# Patient Record
Sex: Male | Born: 1975
Health system: Southern US, Community
[De-identification: ages and names within clinical notes are randomized; demographics above are authoritative.]

## PROBLEM LIST (undated history)

## (undated) DIAGNOSIS — E119 Type 2 diabetes mellitus without complications: Secondary | ICD-10-CM

---

## 2000-07-13 ENCOUNTER — Encounter: Payer: Self-pay | Admitting: Emergency Medicine

## 2000-07-13 ENCOUNTER — Emergency Department (HOSPITAL_COMMUNITY): Admission: EM | Admit: 2000-07-13 | Discharge: 2000-07-13 | Payer: Self-pay | Admitting: Emergency Medicine

## 2003-09-21 ENCOUNTER — Emergency Department (HOSPITAL_COMMUNITY): Admission: EM | Admit: 2003-09-21 | Discharge: 2003-09-21 | Payer: Self-pay | Admitting: Emergency Medicine

## 2011-03-18 ENCOUNTER — Emergency Department (HOSPITAL_COMMUNITY)
Admission: EM | Admit: 2011-03-18 | Discharge: 2011-03-18 | Disposition: A | Discharge status: Home / Self Care | Payer: Self-pay | Attending: Emergency Medicine | Admitting: Emergency Medicine

## 2011-03-18 DIAGNOSIS — K089 Disorder of teeth and supporting structures, unspecified: Secondary | ICD-10-CM | POA: Insufficient documentation

## 2011-03-18 DIAGNOSIS — K029 Dental caries, unspecified: Secondary | ICD-10-CM | POA: Insufficient documentation

## 2011-12-24 ENCOUNTER — Emergency Department (HOSPITAL_COMMUNITY)
Admission: EM | Admit: 2011-12-24 | Discharge: 2011-12-24 | Disposition: A | Discharge status: Home / Self Care | Payer: Self-pay | Attending: Emergency Medicine | Admitting: Emergency Medicine

## 2011-12-24 ENCOUNTER — Encounter (HOSPITAL_COMMUNITY): Payer: Self-pay | Admitting: Emergency Medicine

## 2011-12-24 DIAGNOSIS — K029 Dental caries, unspecified: Secondary | ICD-10-CM | POA: Insufficient documentation

## 2011-12-24 DIAGNOSIS — K0889 Other specified disorders of teeth and supporting structures: Secondary | ICD-10-CM

## 2011-12-24 MED ORDER — OXYCODONE-ACETAMINOPHEN 5-325 MG PO TABS
1.0000 | ORAL_TABLET | Freq: Once | ORAL | Status: AC
  Administered 2011-12-24: 1 via ORAL
  Filled 2011-12-24: qty 1

## 2011-12-24 MED ORDER — PENICILLIN V POTASSIUM 500 MG PO TABS: 500.0000 mg | ORAL_TABLET | Freq: Three times a day (TID) | ORAL | Status: AC

## 2011-12-24 MED ORDER — HYDROCODONE-ACETAMINOPHEN 5-500 MG PO TABS: 1.0000 | ORAL_TABLET | Freq: Four times a day (QID) | ORAL | Status: AC | PRN

## 2011-12-24 MED ORDER — NAPROXEN 500 MG PO TABS: 500.0000 mg | ORAL_TABLET | Freq: Two times a day (BID) | ORAL | Status: AC

## 2011-12-24 NOTE — ED Notes (Signed)
C/o R lower toothache since Saturday.

## 2011-12-24 NOTE — ED Notes (Signed)
Pt states that his tooth broke. Pt states area is painful for chewing. Pt also has abcsess in tooth. Pt states unknown time phrame

## 2011-12-24 NOTE — ED Provider Notes (Signed)
History     CSN: [id number]  Arrival date & time 12/24/11  0131   First MD Initiated Contact with Patient 12/24/11 0142      Chief Complaint  Patient presents with  . Dental Pain    (Consider location/radiation/quality/duration/timing/severity/associated sxs/prior treatment) Patient is a 36 y.o. male presenting with tooth pain. The history is provided by the patient.  Dental PainThe primary symptoms include mouth pain and dental injury. Primary symptoms do not include fever or sore throat. The symptoms began 2 days ago. The symptoms are worsening. The symptoms are recurrent. The symptoms occur constantly.  Additional symptoms include: gum swelling, gum tenderness and jaw pain. Additional symptoms do not include: purulent gums, trismus, facial swelling and ear pain.  Pt states he has had problem with his wisdom teeth. States they grew in wrong and broke off. States he currently has no Secretary/administrator, was told that he needed to get his teeth out. States two days ago, developed severe pain in the right lower molar tooth. States pain and swelling surrounding the tooth. Take ibuprofen and naprosyn with no relief. Denies fever, chills, malaise, facial swelling, swelling under the tongue.   History reviewed. No pertinent past medical history.  History reviewed. No pertinent past surgical history.  No family history on file.  History  Substance Use Topics  . Smoking status: Never Smoker   . Smokeless tobacco: Not on file  . Alcohol Use: No      Review of Systems  Constitutional: Negative for fever and chills.  HENT: Positive for dental problem. Negative for ear pain, congestion, sore throat, facial swelling and neck pain.   Respiratory: Negative.   Cardiovascular: Negative.   Skin: Negative.     Allergies  Review of patient's allergies indicates no known allergies.  Home Medications   Current Outpatient Rx  Name Route Sig Dispense Refill  . IBUPROFEN 100 MG PO CHEW Oral  Chew 100 mg by mouth every 8 (eight) hours as needed. For pain    . NAPROXEN 500 MG PO TABS Oral Take 500 mg by mouth once.      BP 159/110  Pulse 110  Temp 98.7 F (37.1 C) (Oral)  Resp 18  SpO2 98%  Physical Exam  Nursing note and vitals reviewed. Constitutional: He is oriented to person, place, and time. He appears well-developed and well-nourished. No distress.  HENT:       Decal of the right lower 3rd molar. No obvious swelling or abscess noted. Tooth very tender to palpation.   Neck: Neck supple.  Cardiovascular: Normal rate, regular rhythm and normal heart sounds.   Pulmonary/Chest: Breath sounds normal. No respiratory distress. He has no wheezes. He has no rales.  Lymphadenopathy:    He has no cervical adenopathy.  Neurological: He is alert and oriented to person, place, and time.  Skin: Skin is warm and dry.  Psychiatric: He has a normal mood and affect.    ED Course  Procedures (including critical care time)  Pt with dental carries of the 3rd right lower molar. He is afebrile, non toxic, no obvious abscess, no facial swelling, no adenopathy. Will give pain medications, follow up with a dentist.  1. Pain, dental       MDM         Lottie Mussel, PA 12/24/11 0157

## 2011-12-24 NOTE — Discharge Instructions (Signed)
Penicillin for possible infection. Naprosyn for pain. Vicodin for severe pain as prescribed. Follow up with dentist or an oral surgery for further treatment.   Dental Pain A tooth ache may be caused by cavities (tooth decay). Cavities expose the nerve of the tooth to air and hot or cold temperatures. It may come from an infection or abscess (also called a boil or furuncle) around your tooth. It is also often caused by dental caries (tooth decay). This causes the pain you are having. DIAGNOSIS  Your caregiver can diagnose this problem by exam. TREATMENT   If caused by an infection, it may be treated with medications which kill germs (antibiotics) and pain medications as prescribed by your caregiver. Take medications as directed.   Only take over-the-counter or prescription medicines for pain, discomfort, or fever as directed by your caregiver.   Whether the tooth ache today is caused by infection or dental disease, you should see your dentist as soon as possible for further care.  SEEK MEDICAL CARE IF: The exam and treatment you received today has been provided on an emergency basis only. This is not a substitute for complete medical or dental care. If your problem worsens or new problems (symptoms) appear, and you are unable to meet with your dentist, call or return to this location. SEEK IMMEDIATE MEDICAL CARE IF:   You have a fever.   You develop redness and swelling of your face, jaw, or neck.   You are unable to open your mouth.   You have severe pain uncontrolled by pain medicine.  MAKE SURE YOU:   Understand these instructions.   Will watch your condition.   Will get help right away if you are not doing well or get worse.  Document Released: 06/25/2005 Document Revised: 06/14/2011 Document Reviewed: 02/11/2008 [facility] Patient Information 2012 [place], [place].

## 2012-01-06 NOTE — ED Provider Notes (Signed)
Medical screening examination/treatment/procedure(s) were performed by non-physician practitioner and as supervising physician I was immediately available for consultation/collaboration.    Vida Roller, MD 01/06/12 [phone]

## 2014-09-29 ENCOUNTER — Other Ambulatory Visit: Payer: Self-pay | Admitting: Physician Assistant

## 2014-09-29 ENCOUNTER — Ambulatory Visit
Admission: RE | Admit: 2014-09-29 | Discharge: 2014-09-29 | Disposition: A | Discharge status: Home / Self Care | Payer: Self-pay | Source: Ambulatory Visit | Attending: Physician Assistant | Admitting: Physician Assistant

## 2014-09-29 DIAGNOSIS — R062 Wheezing: Secondary | ICD-10-CM

## 2015-06-30 ENCOUNTER — Emergency Department (HOSPITAL_COMMUNITY): Payer: BLUE CROSS/BLUE SHIELD

## 2015-06-30 ENCOUNTER — Emergency Department (HOSPITAL_COMMUNITY)
Admission: EM | Admit: 2015-06-30 | Discharge: 2015-06-30 | Disposition: A | Discharge status: Home / Self Care | Payer: BLUE CROSS/BLUE SHIELD | Attending: Emergency Medicine | Admitting: Emergency Medicine

## 2015-06-30 ENCOUNTER — Encounter (HOSPITAL_COMMUNITY): Payer: Self-pay | Admitting: Emergency Medicine

## 2015-06-30 DIAGNOSIS — R079 Chest pain, unspecified: Secondary | ICD-10-CM | POA: Diagnosis present

## 2015-06-30 DIAGNOSIS — M6281 Muscle weakness (generalized): Secondary | ICD-10-CM | POA: Diagnosis not present

## 2015-06-30 DIAGNOSIS — R739 Hyperglycemia, unspecified: Secondary | ICD-10-CM | POA: Diagnosis not present

## 2015-06-30 DIAGNOSIS — M549 Dorsalgia, unspecified: Secondary | ICD-10-CM | POA: Insufficient documentation

## 2015-06-30 DIAGNOSIS — R0789 Other chest pain: Secondary | ICD-10-CM | POA: Diagnosis not present

## 2015-06-30 LAB — BASIC METABOLIC PANEL
Anion gap: 10 (ref 5–15)
BUN: 18 mg/dL (ref 6–20)
CALCIUM: 9.4 mg/dL (ref 8.9–10.3)
CO2: 26 mmol/L (ref 22–32)
CREATININE: 1.15 mg/dL (ref 0.61–1.24)
Chloride: 100 mmol/L — ABNORMAL LOW (ref 101–111)
Glucose, Bld: 313 mg/dL — ABNORMAL HIGH (ref 65–99)
Potassium: 4 mmol/L (ref 3.5–5.1)
SODIUM: 136 mmol/L (ref 135–145)

## 2015-06-30 LAB — CBC
HCT: 41.6 % (ref 39.0–52.0)
Hemoglobin: 14.1 g/dL (ref 13.0–17.0)
MCH: 27.9 pg (ref 26.0–34.0)
MCHC: 33.9 g/dL (ref 30.0–36.0)
MCV: 82.4 fL (ref 78.0–100.0)
PLATELETS: 316 10*3/uL (ref 150–400)
RBC: 5.05 MIL/uL (ref 4.22–5.81)
RDW: 12.3 % (ref 11.5–15.5)
WBC: 7.2 10*3/uL (ref 4.0–10.5)

## 2015-06-30 LAB — I-STAT TROPONIN, ED: TROPONIN I, POC: 0 ng/mL (ref 0.00–0.08)

## 2015-06-30 MED ORDER — METFORMIN HCL 500 MG PO TABS
500.0000 mg | ORAL_TABLET | Freq: Once | ORAL | Status: AC
  Administered 2015-06-30: 500 mg via ORAL
  Filled 2015-06-30: qty 1

## 2015-06-30 MED ORDER — METFORMIN HCL 500 MG PO TABS: 500.0000 mg | ORAL_TABLET | Freq: Two times a day (BID) | ORAL | Status: DC

## 2015-06-30 NOTE — ED Provider Notes (Signed)
CSN: [id number]     Arrival date & time 06/30/15  1041 History   First MD Initiated Contact with Patient 06/30/15 1056     Chief Complaint  Patient presents with  . Chest Pain  . Back Pain  . hand weakness     HPI  Patient presents with concern of chest pain. Patient has had 2 prior, brief episodes in the past week, but today, had a slightly more sustained event. He was at rest, at work, when he had approximate 5-10 minutes of pressure in the central chest. With no radiation, no concurrent dyspnea, no lightheadedness, no sicca be, no nausea. Patient denies known medical problems, does not smoke, drinks minimally. He has a family history of diabetes, hypertension. No recent travel, new medication, no activity. Currently, the patient has no ongoing complaint.   History reviewed. No pertinent past medical history. History reviewed. No pertinent past surgical history. No family history on file. Social History  Substance Use Topics  . Smoking status: Never Smoker   . Smokeless tobacco: None  . Alcohol Use: No    Review of Systems  Constitutional:       Per HPI, otherwise negative  HENT:       Per HPI, otherwise negative  Respiratory:       Per HPI, otherwise negative  Cardiovascular:       Per HPI, otherwise negative  Gastrointestinal: Negative for vomiting.  Endocrine:       Negative aside from HPI  Genitourinary:       Neg aside from HPI   Musculoskeletal:       Per HPI, otherwise negative  Skin: Negative.   Neurological: Negative for syncope.      Allergies  Review of patient's allergies indicates no known allergies.  Home Medications   Prior to Admission medications   Medication Sig Start Date End Date Taking? Authorizing Provider  ibuprofen (ADVIL,MOTRIN) 200 MG tablet Take 400 mg by mouth every 6 (six) hours as needed for headache or moderate pain.   Yes Historical Provider, MD   BP 128/94 mmHg  Pulse 78  Temp(Src) 97.9 F (36.6 C) (Oral)  Resp 16   Ht 5\' 8"  (1.727 m)  Wt 240 lb (108.863 kg)  BMI 36.50 kg/m2  SpO2 99% Physical Exam  Constitutional: He is oriented to person, place, and time. He appears well-developed. No distress.  HENT:  Head: Normocephalic and atraumatic.  Eyes: Conjunctivae and EOM are normal.  Cardiovascular: Normal rate and regular rhythm.   Pulmonary/Chest: Effort normal. No stridor. No respiratory distress.  Abdominal: He exhibits no distension. There is no tenderness.  Musculoskeletal: He exhibits no edema.  Neurological: He is alert and oriented to person, place, and time.  Skin: Skin is warm and dry.  Psychiatric: He has a normal mood and affect.  Nursing note and vitals reviewed.   ED Course  Procedures (including critical care time) Labs Review Labs Reviewed  CBC  BASIC METABOLIC PANEL  [facility] SensorI-STAT TROPOININ, ED    Imaging Review Dg Chest 2 View  06/30/2015  CLINICAL DATA:  New mid chest pain this morning. EXAM: CHEST  2 VIEW COMPARISON:  09/29/2014 FINDINGS: The heart size and mediastinal contours are within normal limits. Both lungs are clear. The visualized skeletal structures are unremarkable. IMPRESSION: No active cardiopulmonary disease. Electronically Signed   By: Charlett NoseKevin  Dover M.D.   On: 06/30/2015 11:16   I have personally reviewed and evaluated these images and lab results as part of my medical  decision-making.   EKG Interpretation   Date/Time:  Thursday June 30 2015 10:47:07 EST Ventricular Rate:  69 PR Interval:  161 QRS Duration: 86 QT Interval:  366 QTC Calculation: 392 R Axis:   -38 Text Interpretation:  Sinus rhythm Left axis deviation Probable  anteroseptal infarct, old Minimal ST elevation, lateral leads No previous  ECGs available Confirmed by NGUYEN, EMILY ([id number]) on 06/30/2015 10:51:03  AM     2:00 PM On repeat exam the patient is in no distress. We discussed all findings per Patient states that prior labs demonstrated glucose greater than 300, while  fasting. With 2 values greater than 300, concern for diabetes, the patient will start metformin.  MDM  Presents after episodes of chest pain. Patient is awake, alert, with nonischemic, unremarkable heart rhythm. Patient is found to have hyperglycemia, otherwise all reassuring labs. Given his persistent hyperglycemia, the patient was tried on metformin. Patient discharged in stable condition.  Gerhard Munch, MD 06/30/15 1400

## 2015-06-30 NOTE — Discharge Instructions (Signed)
As discussed, today's evaluation has been largely reassuring aside from evidence of ongoing high blood sugar.  It is very important that you follow-up with your primary care physician, both for repeat laboratory evaluation, as well as for further evaluation of your episodic chest pain.  Issues of new, or concerning changes in your condition, please be sure to return here.

## 2015-06-30 NOTE — ED Notes (Addendum)
Pt c/o central chest pain that started at work little while ago. Pt states that he was sitting at his desk working when pain started.  Pt states that pain last 5-10 mins then went away.  Pt denies any radiation of pain.  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

## 2016-03-21 IMAGING — CR DG CHEST 2V
2 series · 2 of 2 positions shown · non-contrast
Comparison: None.

CLINICAL DATA: Wheezing and chest congestion for 2 months.

EXAM:
CHEST  2 VIEW

[w chest pa]
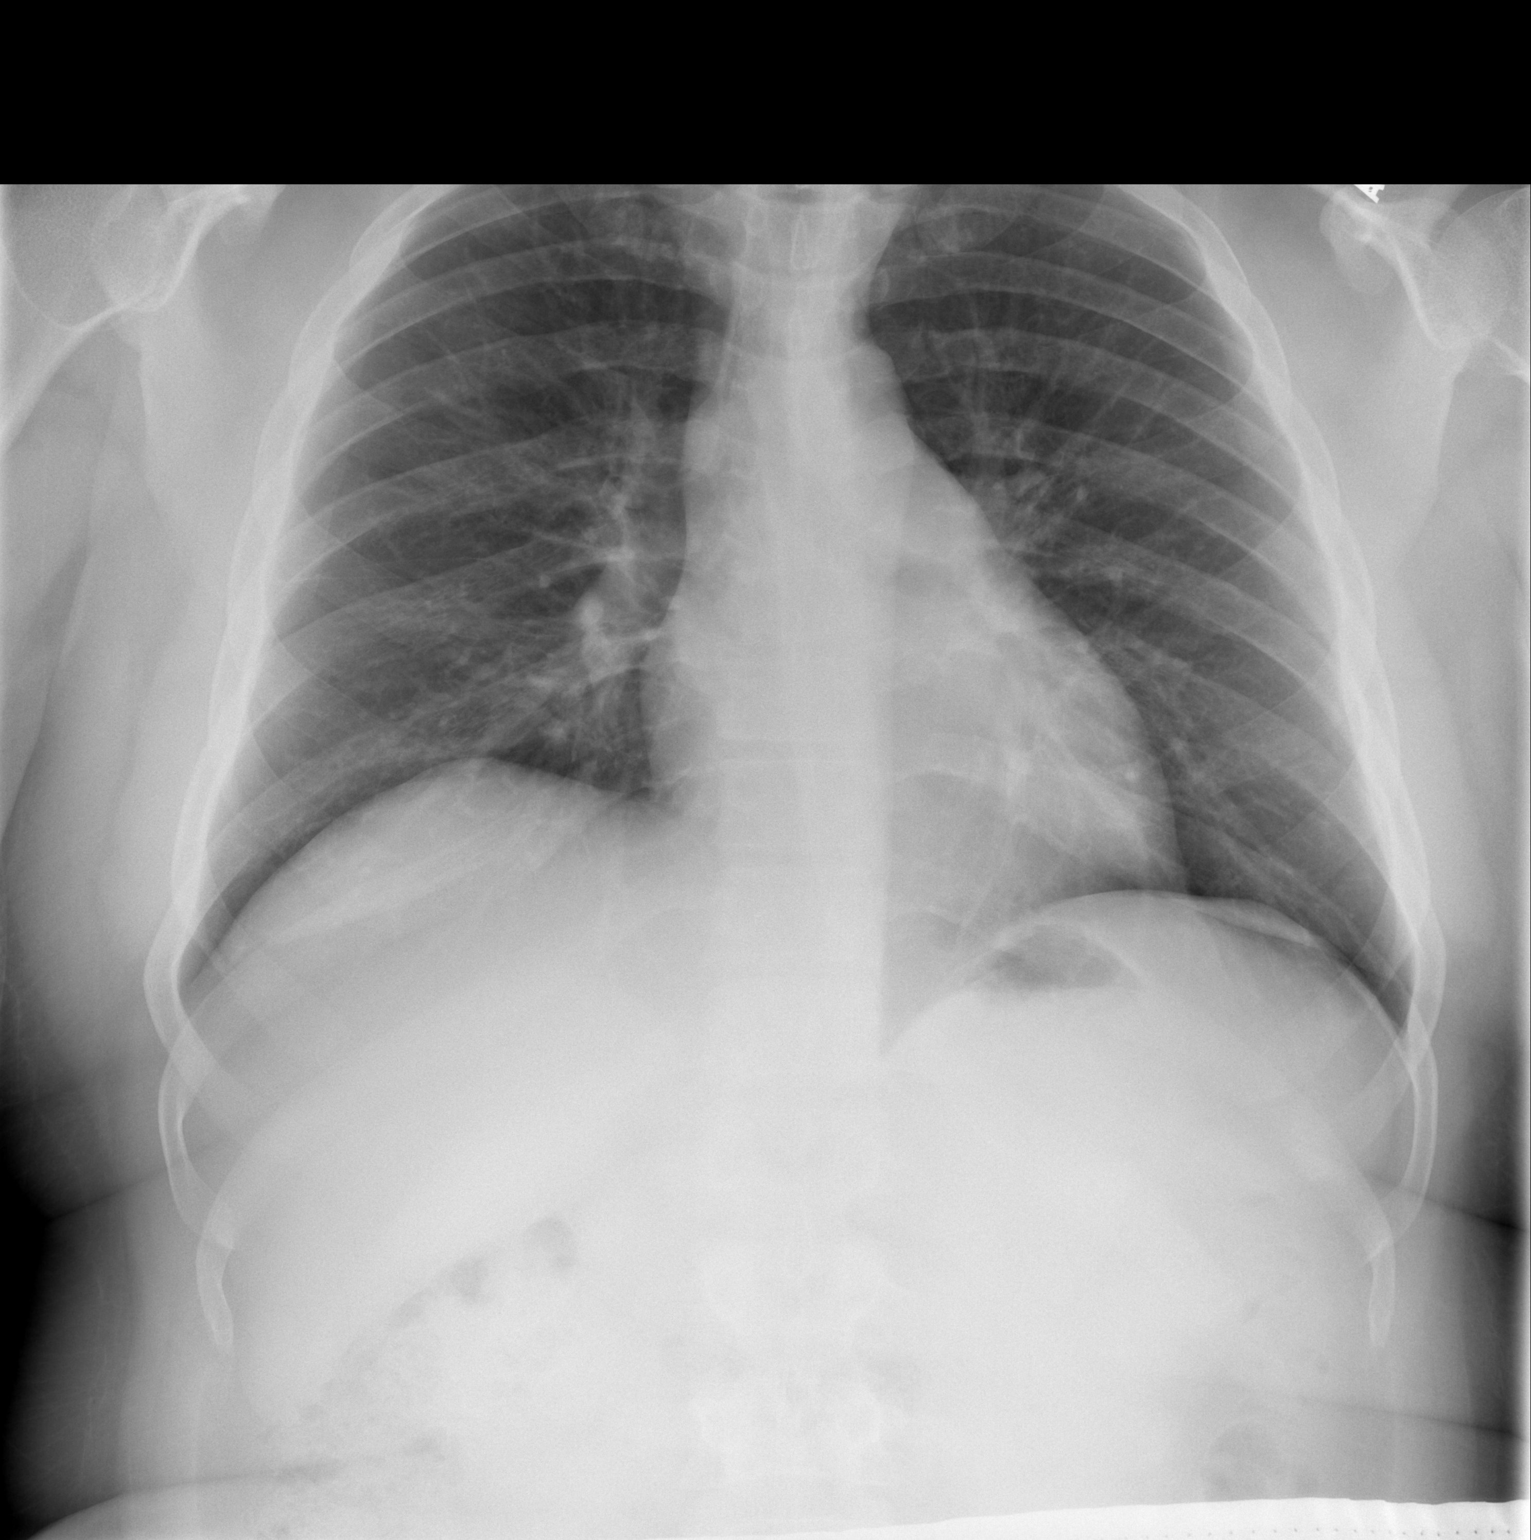

[w chest lat]
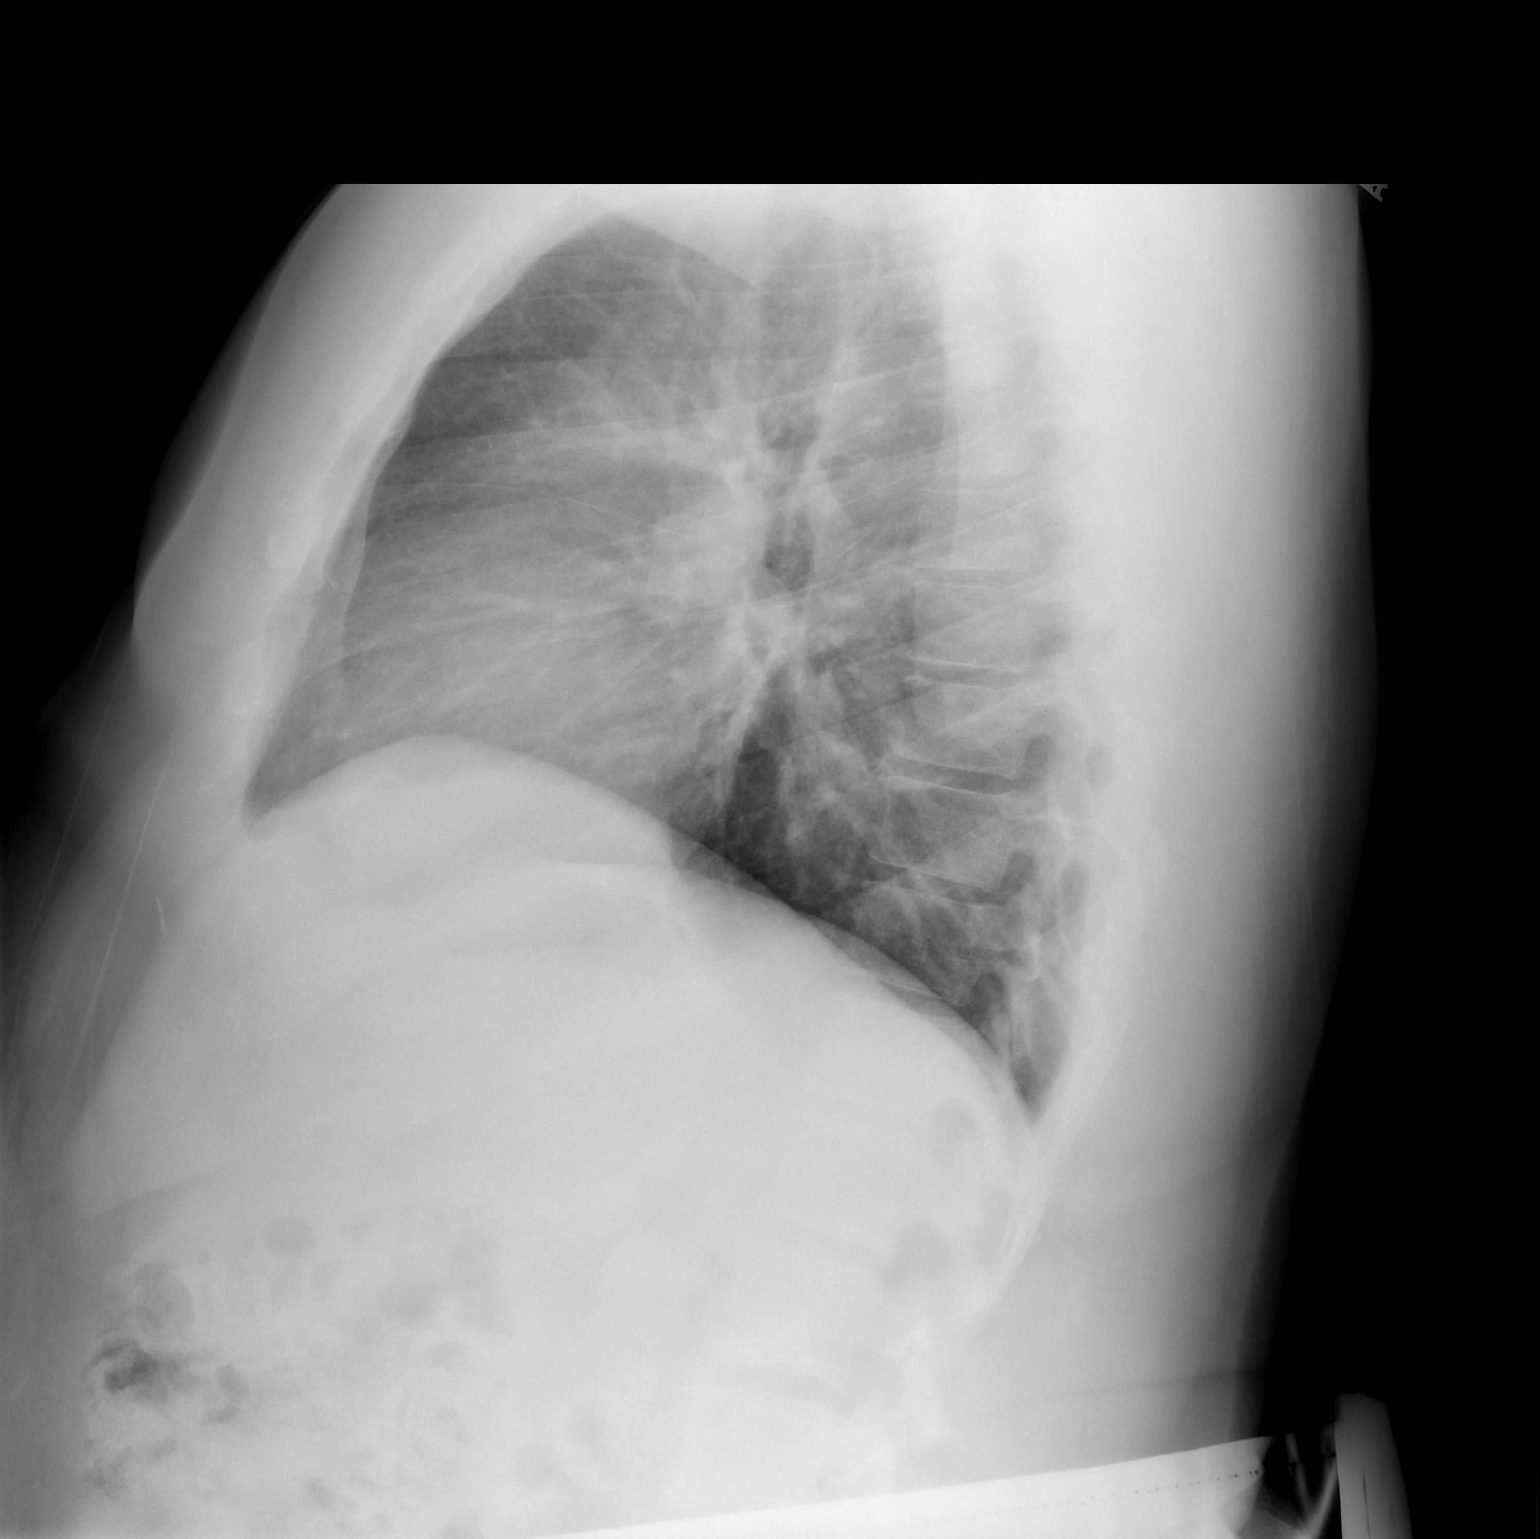

[2 of 2 positions shown; findings below may reference images not displayed]

FINDINGS: The cardiac silhouette, mediastinal and hilar contours are normal.
The lungs are clear. No pleural effusion. The bony thorax is intact.
IMPRESSION: No acute cardiopulmonary findings.

## 2016-10-15 ENCOUNTER — Emergency Department (HOSPITAL_COMMUNITY)
Admission: EM | Admit: 2016-10-15 | Discharge: 2016-10-15 | Disposition: A | Discharge status: Home / Self Care | Payer: BLUE CROSS/BLUE SHIELD | Attending: Emergency Medicine | Admitting: Emergency Medicine

## 2016-10-15 ENCOUNTER — Encounter (HOSPITAL_COMMUNITY): Payer: Self-pay | Admitting: *Deleted

## 2016-10-15 DIAGNOSIS — H9311 Tinnitus, right ear: Secondary | ICD-10-CM | POA: Diagnosis not present

## 2016-10-15 DIAGNOSIS — H66001 Acute suppurative otitis media without spontaneous rupture of ear drum, right ear: Secondary | ICD-10-CM | POA: Diagnosis not present

## 2016-10-15 MED ORDER — AMOXICILLIN 500 MG PO CAPS: 1000.0000 mg | ORAL_CAPSULE | Freq: Two times a day (BID) | ORAL | 0 refills | Status: DC

## 2016-10-15 NOTE — ED Notes (Signed)
Pt states headache x2 days. Tonight was awakened at ~0230 with high-pitched ringing in his ears (primarily the right ear). Pt states headache is a 2/10 now. Location of headache is all around the top of his head. Bright light makes his headache worse. Pt denies any recent injury/ falls/ trauma. Pt states in addition to his diabetes, he also has a history of vertigo which he takes OTC meclizine for.

## 2016-10-15 NOTE — Discharge Instructions (Signed)
Follow up with ENT for further evaluation of this

## 2016-10-15 NOTE — ED Triage Notes (Signed)
Headache for 4 days  Ringing in his rt ear tonight  It woke him up from sleep

## 2016-10-15 NOTE — ED Provider Notes (Signed)
[facility]-EMERGENCY DEPT Provider Note   CSN: [id number] Arrival date & time: 10/15/16  0320     History   Chief Complaint Chief Complaint  Patient presents with  . Headache    HPI Devin Boyd is a 41 y.o. male.  41 yo M with a chief complaints of tinnitus. This been going on acutely this evening. He had a mild headache at the started the week but this resolved. Denies any hearing loss. Denies any injury to his head or ear. Patient denies recent loud noise exposure. He describes as a high-pitched wine. Continuous in nature. He states he has had some congestion as well as a sensation that fluid is moving around in his ears especially when he lays on his left side. He feels that the ringing is mostly in the right ear.   The history is provided by the patient.  Headache   Pertinent negatives include no fever, no palpitations, no shortness of breath and no vomiting.  Illness  This is a new problem. The current episode started 3 to 5 hours ago. The problem occurs constantly. The problem has not changed since onset.Pertinent negatives include no chest pain, no abdominal pain, no headaches and no shortness of breath. Nothing aggravates the symptoms. Nothing relieves the symptoms. He has tried nothing for the symptoms. The treatment provided no relief.    History reviewed. No pertinent past medical history.  There are no active problems to display for this patient.   History reviewed. No pertinent surgical history.     Home Medications    Prior to Admission medications   Medication Sig Start Date End Date Taking? Authorizing Provider  metFORMIN (GLUCOPHAGE) 500 MG tablet Take 1 tablet (500 mg total) by mouth 2 (two) times daily with a meal. 06/30/15  Yes Gerhard Munch, MD  amoxicillin (AMOXIL) 500 MG capsule Take 2 capsules (1,000 mg total) by mouth 2 (two) times daily. 10/15/16   Melene Plan, DO    Family History No family history on file.  Social History Social History    Substance Use Topics  . Smoking status: Never Smoker  . Smokeless tobacco: Never Used  . Alcohol use No     Allergies   Patient has no known allergies.   Review of Systems Review of Systems  Constitutional: Negative for chills and fever.  HENT: Positive for congestion and tinnitus. Negative for facial swelling.   Eyes: Negative for discharge and visual disturbance.  Respiratory: Negative for shortness of breath.   Cardiovascular: Negative for chest pain and palpitations.  Gastrointestinal: Negative for abdominal pain, diarrhea and vomiting.  Musculoskeletal: Negative for arthralgias and myalgias.  Skin: Negative for color change and rash.  Neurological: Negative for tremors, syncope and headaches.  Psychiatric/Behavioral: Negative for confusion and dysphoric mood.     Physical Exam Updated Vital Signs BP 137/87   Pulse 70   Temp 97.8 F (36.6 C)   Resp 18   Ht  (1.727 m)   Wt 280 lb 3 oz (127.1 kg)   SpO2 97%   BMI 42.60 kg/m   Physical Exam  Constitutional: He is oriented to person, place, and time. He appears well-developed and well-nourished.  HENT:  Head: Normocephalic and atraumatic.  Effusion noted to the bilateral TMs. Right TM with some increased vascularity and erythema. No noted bulging.  Eyes: EOM are normal. Pupils are equal, round, and reactive to light.  Neck: Normal range of motion. Neck supple. No JVD present.  Cardiovascular: Normal rate and regular  rhythm.  Exam reveals no gallop and no friction rub.   No murmur heard. Pulmonary/Chest: No respiratory distress. He has no wheezes.  Abdominal: He exhibits no distension and no mass. There is no tenderness. There is no rebound and no guarding.  Musculoskeletal: Normal range of motion.  Neurological: He is alert and oriented to person, place, and time.  Skin: No rash noted. No pallor.  Psychiatric: He has a normal mood and affect. His behavior is normal.  Nursing note and vitals  reviewed.    ED Treatments / Results  Labs (all labs ordered are listed, but only abnormal results are displayed) Labs Reviewed - No data to display  EKG  EKG Interpretation None       Radiology No results found.  Procedures Procedures (including critical care time)  Medications Ordered in ED Medications - No data to display   Initial Impression / Assessment and Plan / ED Course  I have reviewed the triage vital signs and the nursing notes.  Pertinent labs & imaging results that were available during my care of the patient were reviewed by me and considered in my medical decision making (see chart for details).     41 yo M With a chief complaint of tinnitus. He has a normal neuro exam. Right TM is likely infected based on exam. Will start on antibiotics. Patient has had a history of some peripheral vertigo off and on with new tinnitus will have him see ENT for further evaluation.  6:54 AM:  I have discussed the diagnosis/risks/treatment options with the patient and family and believe the pt to be eligible for discharge home to follow-up with ENT. We also discussed returning to the ED immediately if new or worsening sx occur. We discussed the sx which are most concerning (e.g., sudden worsening pain, fever, inability to tolerate by mouth) that necessitate immediate return. Medications administered to the patient during their visit and any new prescriptions provided to the patient are listed below.  Medications given during this visit Medications - No data to display   The patient appears reasonably screen and/or stabilized for discharge and I doubt any other medical condition or other [facility] requiring further screening, evaluation, or treatment in the ED at this time prior to discharge.    Final Clinical Impressions(s) / ED Diagnoses   Final diagnoses:  Tinnitus of right ear  Acute suppurative otitis media of right ear without spontaneous rupture of tympanic membrane,  recurrence not specified    New Prescriptions New Prescriptions   AMOXICILLIN (AMOXIL) 500 MG CAPSULE    Take 2 capsules (1,000 mg total) by mouth 2 (two) times daily.     Melene Plan, DO 10/15/16 [phone]

## 2016-12-20 IMAGING — CR DG CHEST 2V
2 series · 2 of 2 positions shown · non-contrast
Comparison: 09/29/2014

CLINICAL DATA: New mid chest pain this morning.

EXAM:
CHEST  2 VIEW

[w chest pa]
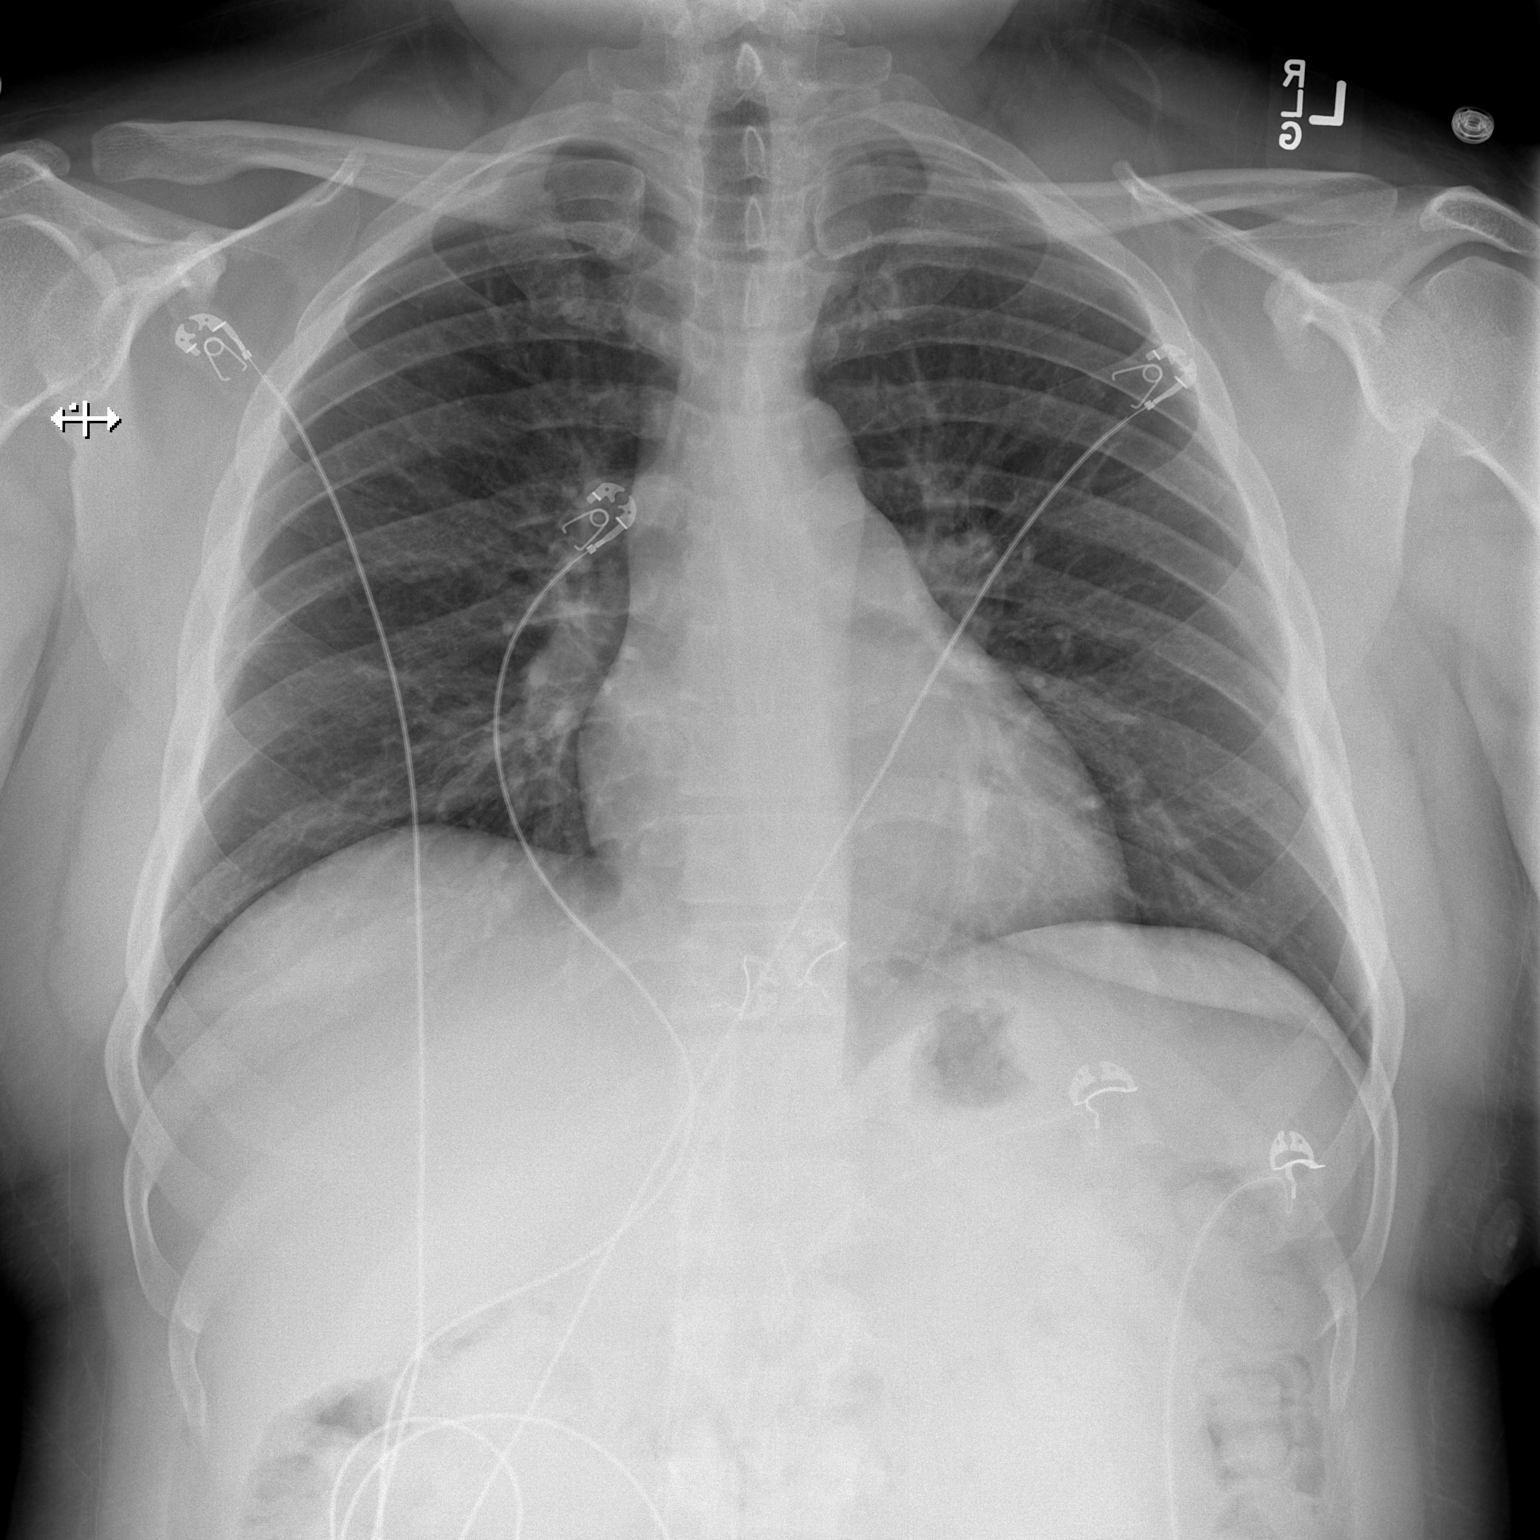

[w chest lat]
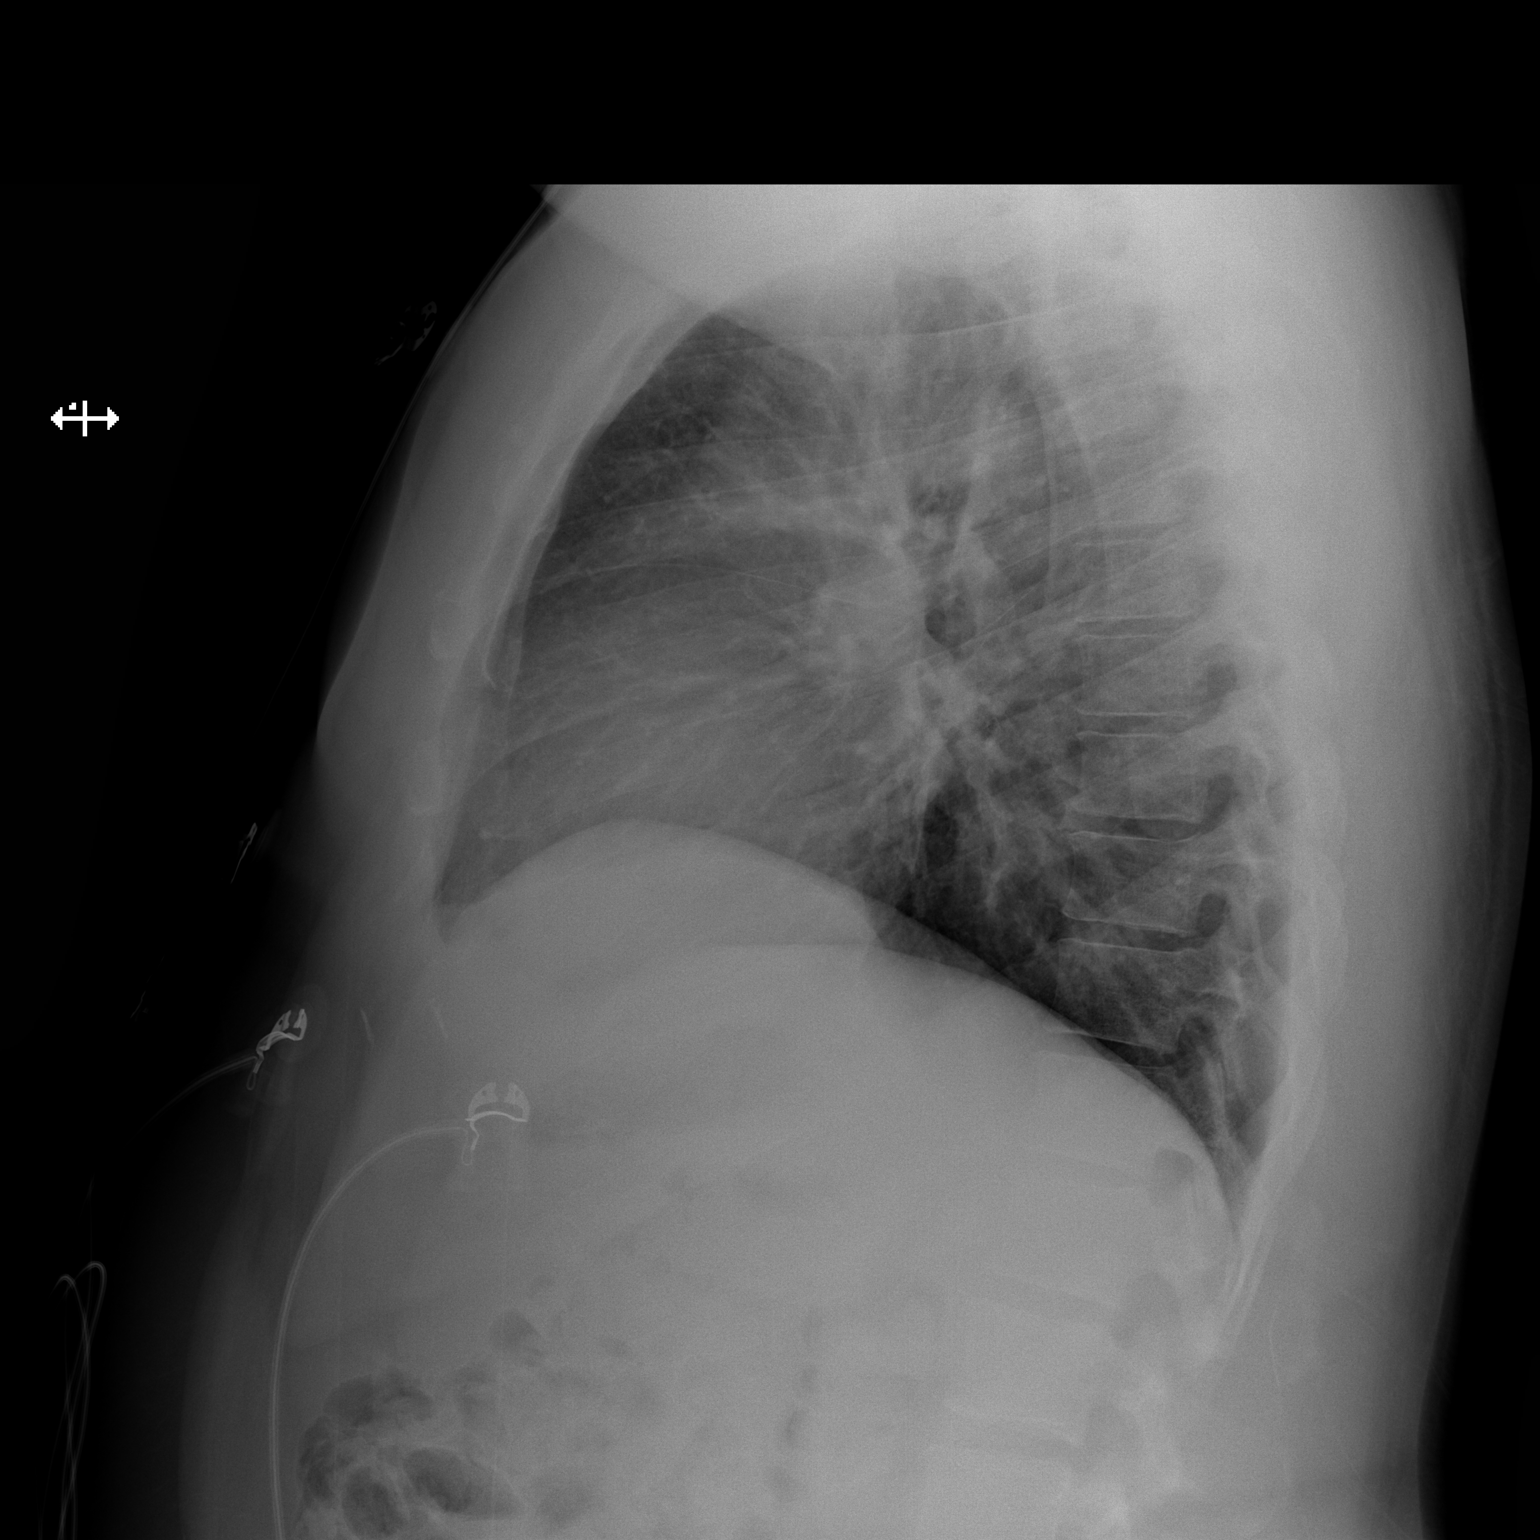

[2 of 2 positions shown; findings below may reference images not displayed]

FINDINGS: The heart size and mediastinal contours are within normal limits.
Both lungs are clear. The visualized skeletal structures are
unremarkable.
IMPRESSION: No active cardiopulmonary disease.

## 2017-08-05 ENCOUNTER — Emergency Department (HOSPITAL_COMMUNITY): Payer: BLUE CROSS/BLUE SHIELD

## 2017-08-05 ENCOUNTER — Other Ambulatory Visit: Payer: Self-pay

## 2017-08-05 ENCOUNTER — Encounter (HOSPITAL_COMMUNITY): Payer: Self-pay

## 2017-08-05 ENCOUNTER — Emergency Department (HOSPITAL_COMMUNITY)
Admission: EM | Admit: 2017-08-05 | Discharge: 2017-08-05 | Disposition: A | Discharge status: Home / Self Care | Payer: BLUE CROSS/BLUE SHIELD | Attending: Emergency Medicine | Admitting: Emergency Medicine

## 2017-08-05 DIAGNOSIS — E119 Type 2 diabetes mellitus without complications: Secondary | ICD-10-CM | POA: Diagnosis not present

## 2017-08-05 DIAGNOSIS — Z7984 Long term (current) use of oral hypoglycemic drugs: Secondary | ICD-10-CM | POA: Diagnosis not present

## 2017-08-05 DIAGNOSIS — B349 Viral infection, unspecified: Secondary | ICD-10-CM | POA: Diagnosis not present

## 2017-08-05 DIAGNOSIS — R509 Fever, unspecified: Secondary | ICD-10-CM | POA: Diagnosis present

## 2017-08-05 HISTORY — DX: Type 2 diabetes mellitus without complications: E11.9

## 2017-08-05 MED ORDER — NAPROXEN 500 MG PO TABS: 500.0000 mg | ORAL_TABLET | Freq: Two times a day (BID) | ORAL | 0 refills | Status: AC

## 2017-08-05 MED ORDER — FLUTICASONE PROPIONATE 50 MCG/ACT NA SUSP: 1.0000 | Freq: Every day | NASAL | 2 refills | Status: DC

## 2017-08-05 MED ORDER — ACETAMINOPHEN 325 MG PO TABS
650.0000 mg | ORAL_TABLET | Freq: Once | ORAL | Status: AC
Start: 2017-08-05 — End: 2017-08-05
  Administered 2017-08-05: 650 mg via ORAL
  Filled 2017-08-05: qty 2

## 2017-08-05 MED ORDER — BENZONATATE 100 MG PO CAPS: 100.0000 mg | ORAL_CAPSULE | Freq: Three times a day (TID) | ORAL | 0 refills | Status: DC | PRN

## 2017-08-05 NOTE — ED Triage Notes (Signed)
Patient complains of dry cough with congestion, sore throat and low grade fever since riding bus this past Wednesday, NAD

## 2017-08-05 NOTE — Discharge Instructions (Signed)
You were seen in the emergency today and diagnosed with a viral illness.  Your chest x-ray did not show pneumonia. I have prescribed you multiple medications to treat your symptoms.   -Flonase to be used 1 spray in each nostril daily.  This medication is used to treat your congestion.  -Tessalon can be taken once every 8 hours as needed.  This medication is used to treat your cough.  - Naproxen- Naproxen is a nonsteroidal anti-inflammatory medication that will help with pain and swelling. Be sure to take this medication as prescribed with food, 1 pill every 12 hours,  It should be taken with food, as it can cause stomach upset, and more seriously, stomach bleeding. Do not take other nonsteroidal anti-inflammatory medications with this such as Advil, Motrin, or Aleve. You may supplement with Tylenol.   You will need to follow-up with your primary care provider in 1 week if your symptoms have not improved.  If you do not have a primary care provider one is provided in your discharge instructions.  Return to the emergency department for any new or worsening symptoms including but not limited to persistent fever for 5 days, difficulty breathing, chest pain, or passing out.

## 2017-08-05 NOTE — ED Notes (Signed)
Patient transported to X-ray 

## 2017-08-05 NOTE — ED Provider Notes (Signed)
[facility] EMERGENCY DEPARTMENT Provider Note   CSN: [id number] Arrival date & time: 08/05/17  1049     History   Chief Complaint No chief complaint on file.   HPI Devin Boyd is a 42 y.o. male with a hx of DM who presents to the ED complaining progressively worsening URI sxs x 6 days. Patient states he has been experiencing congestion, sore throat, dry cough, chest discomfort only with coughing, low grade fevers, generalized malaise, body aches, and headaches. States headaches are gradual onset with steady progression, hx of similar. Rates overall pain a 9/10 in severity, states coughing and movement makes overall discomfort worse, no significant alleviating factors. Has tried robitussin, mucinex, and Nyquil without relief. Has also tried advil without relief.  Reports recent sick contacts with people on ski trip bus. Patient did receive influenza vaccine. Denies dyspnea, palpitations, ear pain, numbness, weakness, or change in vision.   HPI  Past Medical History:  Diagnosis Date  . Diabetes mellitus without complication (HCC)     There are no active problems to display for this patient.   History reviewed. No pertinent surgical history.     Home Medications    Prior to Admission medications   Medication Sig Start Date End Date Taking? Authorizing Provider  amoxicillin (AMOXIL) 500 MG capsule Take 2 capsules (1,000 mg total) by mouth 2 (two) times daily. 10/15/16   Melene Plan, DO  metFORMIN (GLUCOPHAGE) 500 MG tablet Take 1 tablet (500 mg total) by mouth 2 (two) times daily with a meal. 06/30/15   Gerhard Munch, MD    Family History History reviewed. No pertinent family history.  Social History Social History   Tobacco Use  . Smoking status: Never Smoker  . Smokeless tobacco: Never Used  Substance Use Topics  . Alcohol use: No  . Drug use: No     Allergies   Patient has no known allergies.   Review of Systems Review of Systems    Constitutional: Positive for fever.  HENT: Positive for congestion and sore throat. Negative for ear pain and trouble swallowing.   Eyes: Negative for visual disturbance.  Respiratory: Positive for cough. Negative for shortness of breath.   Cardiovascular: Positive for chest pain (only with coughing). Negative for palpitations.  Gastrointestinal: Negative for abdominal pain, diarrhea and vomiting.  Musculoskeletal: Positive for myalgias (generalized).  Neurological: Positive for headaches. Negative for weakness and numbness.  All other systems reviewed and are negative.    Physical Exam Updated Vital Signs BP (!) 148/94 (BP Location: Right Arm)   Pulse (!) 102   Temp 100 F (37.8 C) (Oral)   Resp 16   SpO2 93%   Physical Exam  Constitutional: He appears well-developed and well-nourished.  Non-toxic appearance. No distress.  HENT:  Head: Normocephalic and atraumatic.  Right Ear: Tympanic membrane is not perforated, not erythematous, not retracted and not bulging.  Left Ear: Tympanic membrane is not perforated, not erythematous, not retracted and not bulging.  Nose: Mucosal edema present. Right sinus exhibits no maxillary sinus tenderness and no frontal sinus tenderness. Left sinus exhibits no maxillary sinus tenderness and no frontal sinus tenderness.  Mouth/Throat: Uvula is midline and oropharynx is clear and moist. No oropharyngeal exudate or posterior oropharyngeal erythema.  Eyes: Conjunctivae and EOM are normal. Pupils are equal, round, and reactive to light. Right eye exhibits no discharge. Left eye exhibits no discharge.  Neck: Normal range of motion. Neck supple.  Cardiovascular: Normal rate and regular rhythm.  No  murmur heard. Pulmonary/Chest: Breath sounds normal. No respiratory distress. He has no wheezes. He has no rales.  Abdominal: Soft. He exhibits no distension. There is no tenderness.  Lymphadenopathy:    He has no cervical adenopathy.  Neurological: He is  alert.  Clear speech.  5 out of 5 grip strength bilaterally.  5 out of 5 strength with plantar and dorsiflexion bilaterally.  Sensation grossly intact bilateral upper and lower extremities.  Gait normal.  Skin: Skin is warm and dry. No rash noted.  Psychiatric: He has a normal mood and affect. His behavior is normal.  Nursing note and vitals reviewed.   ED Treatments / Results  Labs (all labs ordered are listed, but only abnormal results are displayed) Labs Reviewed - No data to display  EKG  EKG Interpretation None       Radiology Dg Chest 2 View  Result Date: 08/05/2017 CLINICAL DATA:  Nonproductive cough, chest congestion, low-grade fever, and sore throat for the past 5 days. Nonsmoker. EXAM: CHEST  2 VIEW COMPARISON:  Chest x-ray of June 30, 2015 FINDINGS: The lungs are adequately inflated. There is no focal infiltrate. There is no pleural effusion. The heart and pulmonary vascularity are normal. The mediastinum is normal in width. The trachea is midline. The bony thorax exhibits no acute abnormality. IMPRESSION: There is no pneumonia nor other acute cardiopulmonary abnormality. Electronically Signed   By: David  SwazilandJordan M.D.   On: 08/05/2017 11:45    Procedures Procedures (including critical care time)  Medications Ordered in ED Medications - No data to display   Initial Impression / Assessment and Plan / ED Course  I have reviewed the triage vital signs and the nursing notes.  Pertinent labs & imaging results that were available during my care of the patient were reviewed by me and considered in my medical decision making (see chart for details).    Patient presents with symptoms consistent with viral illness. He is nontoxic appearing, initially slightly tachycardic this improved on my exam, also with elevated blood pressure- no indication of HTN emergency, discussed with patient he will need to have this rechecked. Patient is afebrile and without adventitious sounds on  lung exam, CXR negative for infiltrate, doubt PNA. No wheezing on exam. Afebrile, no sinus tenderness, doubt sinusitis. Centor score 0, doubt strep pharyngitis. Suspect viral etiology, will treat supportively with Naproxen, Flonase, and Tessalon. I discussed results, treatment plan, need for PCP follow-up, and return precautions with the patient. Provided opportunity for questions, patient confirmed understanding and is in agreement with plan.    Final Clinical Impressions(s) / ED Diagnoses   Final diagnoses:  Viral illness    ED Discharge Orders        Ordered    naproxen (NAPROSYN) 500 MG tablet  2 times daily     08/05/17 1225    fluticasone (FLONASE) 50 MCG/ACT nasal spray  Daily     08/05/17 1225    benzonatate (TESSALON) 100 MG capsule  3 times daily PRN     08/05/17 [address]1225       Anup Brigham, ElwoodSamantha R, PA-C 08/05/17 1757    Bethann BerkshireZammit, Joseph, MD 08/10/17 1154

## 2017-08-05 NOTE — ED Notes (Signed)
Pt stable, ambulatory and verbalizes understanding of d/c instructions.   Pt unable to sign signature pad due to a connection issue.

## 2018-01-16 DIAGNOSIS — E118 Type 2 diabetes mellitus with unspecified complications: Secondary | ICD-10-CM | POA: Diagnosis not present

## 2018-01-16 DIAGNOSIS — Z9109 Other allergy status, other than to drugs and biological substances: Secondary | ICD-10-CM | POA: Diagnosis not present

## 2018-01-16 DIAGNOSIS — E782 Mixed hyperlipidemia: Secondary | ICD-10-CM | POA: Diagnosis not present

## 2018-01-16 DIAGNOSIS — K649 Unspecified hemorrhoids: Secondary | ICD-10-CM | POA: Diagnosis not present

## 2018-01-27 DIAGNOSIS — E782 Mixed hyperlipidemia: Secondary | ICD-10-CM | POA: Diagnosis not present

## 2018-01-27 DIAGNOSIS — E1169 Type 2 diabetes mellitus with other specified complication: Secondary | ICD-10-CM | POA: Diagnosis not present

## 2018-06-09 DIAGNOSIS — K649 Unspecified hemorrhoids: Secondary | ICD-10-CM | POA: Diagnosis not present

## 2018-06-09 DIAGNOSIS — E1169 Type 2 diabetes mellitus with other specified complication: Secondary | ICD-10-CM | POA: Diagnosis not present

## 2018-06-09 DIAGNOSIS — Z9109 Other allergy status, other than to drugs and biological substances: Secondary | ICD-10-CM | POA: Diagnosis not present

## 2018-06-09 DIAGNOSIS — E782 Mixed hyperlipidemia: Secondary | ICD-10-CM | POA: Diagnosis not present

## 2018-06-11 DIAGNOSIS — E782 Mixed hyperlipidemia: Secondary | ICD-10-CM | POA: Diagnosis not present

## 2018-06-11 DIAGNOSIS — E1169 Type 2 diabetes mellitus with other specified complication: Secondary | ICD-10-CM | POA: Diagnosis not present

## 2018-06-18 DIAGNOSIS — E782 Mixed hyperlipidemia: Secondary | ICD-10-CM | POA: Diagnosis not present

## 2018-06-18 DIAGNOSIS — E1169 Type 2 diabetes mellitus with other specified complication: Secondary | ICD-10-CM | POA: Diagnosis not present

## 2018-10-20 DIAGNOSIS — Z9109 Other allergy status, other than to drugs and biological substances: Secondary | ICD-10-CM | POA: Diagnosis not present

## 2018-10-20 DIAGNOSIS — E1169 Type 2 diabetes mellitus with other specified complication: Secondary | ICD-10-CM | POA: Diagnosis not present

## 2018-10-20 DIAGNOSIS — Z760 Encounter for issue of repeat prescription: Secondary | ICD-10-CM | POA: Diagnosis not present

## 2018-10-20 DIAGNOSIS — E782 Mixed hyperlipidemia: Secondary | ICD-10-CM | POA: Diagnosis not present

## 2019-02-11 DIAGNOSIS — E1169 Type 2 diabetes mellitus with other specified complication: Secondary | ICD-10-CM | POA: Diagnosis not present

## 2019-02-11 DIAGNOSIS — F439 Reaction to severe stress, unspecified: Secondary | ICD-10-CM | POA: Diagnosis not present

## 2019-02-11 DIAGNOSIS — Z760 Encounter for issue of repeat prescription: Secondary | ICD-10-CM | POA: Diagnosis not present

## 2019-02-11 DIAGNOSIS — E782 Mixed hyperlipidemia: Secondary | ICD-10-CM | POA: Diagnosis not present

## 2019-02-12 DIAGNOSIS — Z0000 Encounter for general adult medical examination without abnormal findings: Secondary | ICD-10-CM | POA: Diagnosis not present

## 2019-02-12 DIAGNOSIS — E782 Mixed hyperlipidemia: Secondary | ICD-10-CM | POA: Diagnosis not present

## 2019-02-12 DIAGNOSIS — E1169 Type 2 diabetes mellitus with other specified complication: Secondary | ICD-10-CM | POA: Diagnosis not present

## 2019-07-06 DIAGNOSIS — Z20828 Contact with and (suspected) exposure to other viral communicable diseases: Secondary | ICD-10-CM | POA: Diagnosis not present

## 2019-07-28 DIAGNOSIS — Z03818 Encounter for observation for suspected exposure to other biological agents ruled out: Secondary | ICD-10-CM | POA: Diagnosis not present

## 2019-07-28 DIAGNOSIS — R030 Elevated blood-pressure reading, without diagnosis of hypertension: Secondary | ICD-10-CM | POA: Diagnosis not present

## 2019-07-28 DIAGNOSIS — Z20828 Contact with and (suspected) exposure to other viral communicable diseases: Secondary | ICD-10-CM | POA: Diagnosis not present

## 2019-08-02 DIAGNOSIS — Z20828 Contact with and (suspected) exposure to other viral communicable diseases: Secondary | ICD-10-CM | POA: Diagnosis not present

## 2019-12-02 DIAGNOSIS — H539 Unspecified visual disturbance: Secondary | ICD-10-CM | POA: Diagnosis not present

## 2019-12-02 DIAGNOSIS — E119 Type 2 diabetes mellitus without complications: Secondary | ICD-10-CM | POA: Diagnosis not present

## 2019-12-02 DIAGNOSIS — E1169 Type 2 diabetes mellitus with other specified complication: Secondary | ICD-10-CM | POA: Diagnosis not present

## 2020-10-06 DIAGNOSIS — E1169 Type 2 diabetes mellitus with other specified complication: Secondary | ICD-10-CM | POA: Diagnosis not present

## 2020-12-01 ENCOUNTER — Other Ambulatory Visit: Payer: Self-pay

## 2020-12-01 ENCOUNTER — Encounter: Payer: Self-pay | Admitting: Internal Medicine

## 2020-12-01 ENCOUNTER — Ambulatory Visit (INDEPENDENT_AMBULATORY_CARE_PROVIDER_SITE_OTHER): Payer: BC Managed Care – PPO | Admitting: Internal Medicine

## 2020-12-01 VITALS — BP 136/90 | HR 77 | Ht 68.0 in | Wt 255.4 lb

## 2020-12-01 DIAGNOSIS — E785 Hyperlipidemia, unspecified: Secondary | ICD-10-CM | POA: Diagnosis not present

## 2020-12-01 DIAGNOSIS — E1165 Type 2 diabetes mellitus with hyperglycemia: Secondary | ICD-10-CM

## 2020-12-01 LAB — POCT GLUCOSE (DEVICE FOR HOME USE): POC Glucose: 374 mg/dl — AB (ref 70–99)

## 2020-12-01 LAB — POCT GLYCOSYLATED HEMOGLOBIN (HGB A1C): Hemoglobin A1C: 12.3 % — AB (ref 4.0–5.6)

## 2020-12-01 MED ORDER — DEXCOM G6 SENSOR MISC: 1.0000 | 3 refills | Status: AC

## 2020-12-01 MED ORDER — DEXCOM G6 TRANSMITTER MISC: 1.0000 | 3 refills | Status: AC

## 2020-12-01 MED ORDER — VICTOZA 18 MG/3ML ~~LOC~~ SOPN: 1.8000 mg | PEN_INJECTOR | Freq: Every day | SUBCUTANEOUS | 3 refills | Status: DC

## 2020-12-01 MED ORDER — GLIPIZIDE 10 MG PO TABS: 20.0000 mg | ORAL_TABLET | Freq: Two times a day (BID) | ORAL | 1 refills | Status: DC

## 2020-12-01 NOTE — Patient Instructions (Addendum)
-   STOP Glipizide 10 mg XL - START Glipizide ( regular) 10 mg TWO tablets before Breakfast and TWO tablet before Supper  - Increase Victoza to 1.8 mg daily     Choose healthy, lower carb lower calorie snacks: toss salad, vegetables, cottage cheese, peanut butter, low fat cheese / string cheese, lower sodium deli meat, tuna salad or chicken salad    HOW TO TREAT LOW BLOOD SUGARS (Blood sugar LESS THAN 70 MG/DL)  Please follow the RULE OF 15 for the treatment of hypoglycemia treatment (when your (blood sugars are less than 70 mg/dL)    STEP 1: Take 15 grams of carbohydrates when your blood sugar is low, which includes:   3-4 GLUCOSE TABS  OR  3-4 OZ OF JUICE OR REGULAR SODA OR  ONE TUBE OF GLUCOSE GEL     STEP 2: RECHECK blood sugar in 15 MINUTES STEP 3: If your blood sugar is still low at the 15 minute recheck --> then, go back to STEP 1 and treat AGAIN with another 15 grams of carbohydrates.

## 2020-12-01 NOTE — Progress Notes (Signed)
Name: Devin Boyd  MRN/ DOB: [id number], 1975/10/20   Age/ Sex: 45 y.o., male    PCP: Blair Heys, MD   Reason for Endocrinology Evaluation: Type 2 Diabetes Mellitus     Date of Initial Endocrinology Visit: 12/01/2020     PATIENT IDENTIFIER: Devin Boyd is a 45 y.o. male with a past medical history of T2DM and Dyslipidemia. The patient presented for initial endocrinology clinic visit on 12/01/2020 for consultative assistance with his diabetes management.    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 Patient required assistance for hypoglycemia: no  Patient has required hospitalization within the last 1 year from hyper or hypoglycemia: no   In terms of diet, the patient eats 1-2 meals a day, snacks occasionally, drinks sugar-sweet ned beverages   Has lost a brother and mother recently and has neglected his health     HOME DIABETES REGIMEN: Glipizide ER 10 mg daily  Victoza 1.2 mg daily - has not taken since 07/2020    Statin: no ACE-I/ARB: no Prior Diabetic Education:yes    METER DOWNLOAD SUMMARY: Did not bring     DIABETIC COMPLICATIONS: Microvascular complications:   Denies:  CKD, retinopathy, neuropathy  Last eye exam: Completed 06/2019  Macrovascular complications:   Denies: CAD, PVD, CVA   PAST HISTORY: Past Medical History:  Past Medical History:  Diagnosis Date  . Diabetes mellitus without complication [facility])    Past Surgical History: No past surgical history on file.  Social History:  reports that he has never smoked. He has never used smokeless tobacco. He reports that he does not drink alcohol and does not use drugs. Family History: No family history  on file.   HOME MEDICATIONS: Allergies as of 12/01/2020      Reactions   Metformin Hcl Other (See Comments)      Medication List       Accurate as of Dec 01, 2020 10:55 AM. If you have any questions, ask your nurse or doctor.        STOP taking these medications   amoxicillin 500 MG capsule Commonly known as: AMOXIL Stopped by: Scarlette Shorts, MD   benzonatate 100 MG capsule Commonly known as: TESSALON Stopped by: Scarlette Shorts, MD   fluticasone 50 MCG/ACT nasal spray Commonly known as: FLONASE Stopped by: Scarlette Shorts, MD   metFORMIN 500 MG tablet Commonly known as: GLUCOPHAGE Stopped by: Scarlette Shorts, MD     TAKE these medications   glipiZIDE 10 MG 24 hr tablet Commonly known as: GLUCOTROL XL Take 10 mg by mouth every morning.   naproxen 500 MG tablet Commonly known as: NAPROSYN Take 1 tablet (500 mg total) by mouth 2 (two) times daily.   Victoza 18 MG/3ML Sopn Generic drug: liraglutide Inject 1.8 mg into the skin daily.        ALLERGIES: Allergies  Allergen Reactions  . Metformin Hcl Other (See Comments)     REVIEW OF SYSTEMS: A comprehensive ROS was conducted with the patient and is negative except as per HPI and below:  Review of Systems  Gastrointestinal: Positive for diarrhea. Negative for nausea.  Genitourinary: Positive for frequency.  Endo/Heme/Allergies: Negative for polydipsia.      OBJECTIVE:   VITAL SIGNS: BP 136/90   Pulse 77   Ht 5\' 8"  (1.727 m)   Wt 255 lb 6 oz (115.8 kg)   SpO2 98%   BMI 38.83 kg/m    PHYSICAL EXAM:  General: Pt appears well and is in NAD  Neck: General: Supple without adenopathy or carotid bruits. Thyroid: Thyroid size normal.  No goiter or nodules appreciated.  Lungs: Clear with good BS bilat   Heart: RRR   Abdomen: Normoactive bowel sounds, soft, nontender, without masses or organomegaly palpable  Extremities:  Lower extremities - No pretibial edema. No lesions.   Skin: Normal texture and temperature to palpation. No rash noted.  Neuro: MS is good with appropriate affect, pt is alert and Ox3    DATA REVIEWED: 10/07/2020   A1c 13.9%  Gluc 439 BUN/Cr 12/1.22 GFR 76 Na 134 K 4.1 ALP 81 AST 12 ALT 12  Tg 184 LDL 167  MA/Cr ratio < 11.3   ASSESSMENT / PLAN / RECOMMENDATIONS:   1) Type 2 Diabetes Mellitus, Poorly controlled, Without complications - Most recent A1c of 12.3% . Goal A1c < 7.0 %.    Plan: GENERAL: I have discussed with the patient the pathophysiology of diabetes. We went over the natural progression of the disease. We talked about both insulin resistance and insulin deficiency. We stressed the importance of lifestyle changes including diet and exercise. I explained the complications associated with diabetes including retinopathy, nephropathy, neuropathy as well as increased risk of cardiovascular disease. We went over the benefit seen with glycemic control.   I explained to the patient that diabetic patients are at higher than normal risk for amputations. With an A1c >10.0% I have recommended insulin but he declines at this time as it would caused more hardship on him, we have opted to try maximizing Glipizide and Victoza and see how he does in 3 months.  Discussed the importance of low carb diet and avoiding sugar-sweetened beverages, he admits to drinking lots of juice .  - Will prescribe dexcom     MEDICATIONS:  STOP Glipizide 10 mg XL - START Glipizide ( regular) 10 mg TWO tablets before Breakfast and TWO tablet before Supper  - Increase Victoza to 1.8 mg daily   EDUCATION / INSTRUCTIONS: BG monitoring instructions: Patient is instructed to check his blood sugars when symptomatic if unable to check daily  Call [facility] if: BG persistently < 70  I reviewed the Rule of 15 for the treatment of hypoglycemia in detail with the patient. Literature supplied.   2) Diabetic complications:  Eye: Does not  have known diabetic retinopathy.  Neuro/ Feet: Does not have known diabetic peripheral neuropathy. Renal: Patient does not have known baseline CKD. He is not on an ACEI/ARB at present.   3) Dyslipidemia :    - Discused cardiovascular benefits of statins , and I have strongly recommended statins, will consider starting a statin on next visit .     F/U in 3 months    Signed electronically by: 10-24-1982, MD  [facility] Endocrinology  [facility] [address] [place], [place] Phone: [phone] FAX: [phone]   CC: [phone], MD [address] [place] Phone: [phone]  Fax: [phone]    Return to Endocrinology clinic as below: No future appointments.

## 2020-12-05 DIAGNOSIS — E785 Hyperlipidemia, unspecified: Secondary | ICD-10-CM | POA: Insufficient documentation

## 2020-12-05 DIAGNOSIS — E1165 Type 2 diabetes mellitus with hyperglycemia: Secondary | ICD-10-CM | POA: Insufficient documentation

## 2020-12-13 ENCOUNTER — Telehealth: Payer: Self-pay | Admitting: Internal Medicine

## 2020-12-13 NOTE — Telephone Encounter (Signed)
Cover my meds sent in a fax for a  PA for pt on June 1st 2022 for pt Continuous Blood Gluc Sensor (DEXCOM G6 SENSOR) MISC  Continuous Blood Gluc Transmit (DEXCOM G6 TRANSMITTER) MISC

## 2020-12-13 NOTE — Telephone Encounter (Addendum)
[facility] Patient Advocate Encounter  Prior Authorization for [facility] AND TRANSMITTER has been submitted.      SENSOR is APPROVED  PA# [id number]   TRANSMITTERfis APPROVED  PA#[id number]    [facility] will continue to follow.   Netty Starring. Dimas Aguas, CPhT Patient Advocate [facility] Phone: [phone] Fax:  [phone]

## 2021-03-03 ENCOUNTER — Ambulatory Visit: Payer: BC Managed Care – PPO | Admitting: Internal Medicine

## 2021-03-03 NOTE — Progress Notes (Deleted)
Name: Devin Boyd  Age/ Sex: 45 y.o., male   MRN/ DOB: [id number], [birth year]     PCP: Blair Heys, MD   Reason for Endocrinology Evaluation: Type 2 Diabetes Mellitus  Initial Endocrine Consultative Visit: 12/01/2020    PATIENT IDENTIFIER: [name] is a 45 y.o. male with a past medical history of T2DM and Dyslipidemia. The patient has followed with Endocrinology clinic since *** for consultative assistance with management of his diabetes.  DIABETIC HISTORY:  Mr. Stankowski was diagnosed with DM yrs ago, Metformin caused diarrhea. His hemoglobin A1c has ranged from  6's % , peaking at 13.9% in 2022.  On his initial visit to our clinic he had an A1c of  12.3%  , he was on Glipizide ER and Victoza . He declined insulin , we opted to maximize on Glipizide and increase Victoza . We prescribed dexcom    SUBJECTIVE:   During the last visit (12/01/2020): A1c 12.3% He declined insulin , we opted to maximize on Glipizide and increase Victoza   Today (03/03/2021): Mr. [name] is here for a follow up on diabetes management. He checks his blood sugars *** times daily, preprandial to breakfast and ***. The patient has *** had hypoglycemic episodes since the last clinic visit, which typically occur *** x / - most often occuring ***. The patient is *** symptomatic with these episodes, with symptoms of {symptoms; hypoglycemia:[id number]}.       HOME DIABETES REGIMEN:  Glipizide 10 mg TWO tablets before Breakfast and TWO tablet before Supper  Victoza to 1.8 mg daily   Statin: no ACE-I/ARB: no Prior Diabetic Education: yes   METER DOWNLOAD SUMMARY: Date range evaluated: *** Fingerstick Blood Glucose Tests = *** Average Number Tests/Day = *** Overall Mean FS Glucose = *** Standard Deviation = ***  BG Ranges: Low = *** High = ***   Hypoglycemic Events/30 Days: BG < 50 = *** Episodes of symptomatic severe hypoglycemia = ***    DIABETIC COMPLICATIONS: Microvascular  complications:   Denies: CKD, retinopathy, neuropathy Last Eye Exam: Completed 06/2019  Macrovascular complications:   Denies: CAD, CVA, PVD   HISTORY:  Past Medical History:  Past Medical History:  Diagnosis Date   Diabetes mellitus without complication (HCC)    Past Surgical History: No past surgical history on file. Social History:  reports that he has never smoked. He has never used smokeless tobacco. He reports that he does not drink alcohol and does not use drugs. Family History: No family history on file.   HOME MEDICATIONS: Allergies as of 03/03/2021       Reactions   Metformin Hcl Other (See Comments)        Medication List        Accurate as of March 03, 2021  7:07 AM. If you have any questions, ask your nurse or doctor.          Dexcom G6 Sensor Misc 1 Device by Does not apply route as directed.   Dexcom G6 Transmitter Misc 1 Device by Does not apply route as directed.   glipiZIDE 10 MG tablet Commonly known as: Glucotrol Take 2 tablets (20 mg total) by mouth 2 (two) times daily before a meal.   naproxen 500 MG tablet Commonly known as: NAPROSYN Take 1 tablet (500 mg total) by mouth 2 (two) times daily.   Victoza 18 MG/3ML Sopn Generic drug: liraglutide Inject 1.8 mg into the skin daily.         OBJECTIVE:   Vital Signs:  There were no vitals taken for this visit.  Wt Readings from Last 3 Encounters:  12/01/20 255 lb 6 oz (115.8 kg)  10/15/16 280 lb 3 oz (127.1 kg)  06/30/15 240 lb (108.9 kg)     Exam: General: Pt appears well and is in NAD  Hydration: Well-hydrated with moist mucous membranes and good skin turgor  HEENT: Head: Unremarkable with good dentition. Oropharynx clear without exudate.  Eyes: External eye exam normal without stare, lid lag or exophthalmos.  EOM intact.  PERRL.  Neck: General: Supple without adenopathy. Thyroid: Thyroid size normal.  No goiter or nodules appreciated. No thyroid bruit.  Lungs: Clear with  good BS bilat with no rales, rhonchi, or wheezes  Heart: RRR with normal S1 and S2 and no gallops; no murmurs; no rub  Abdomen: Normoactive bowel sounds, soft, nontender, without masses or organomegaly palpable  Extremities: No pretibial edema. No tremor. Normal strength and motion throughout. See detailed diabetic foot exam below.  Skin: Normal texture and temperature to palpation. No rash noted. No Acanthosis nigricans/skin tags. No lipohypertrophy.  Neuro: MS is good with appropriate affect, pt is alert and Ox3    DM foot exam:    DATA REVIEWED:  Lab Results  Component Value Date   HGBA1C 12.3 (A) 12/01/2020   Lab Results  Component Value Date   CREATININE 1.15 06/30/2015    10/07/2020     A1c 13.9%  Gluc 439 BUN/Cr 12/1.22 GFR 76 Na 134 K 4.1 ALP 81 AST 12 ALT 12   Tg 184 LDL 167   MA/Cr ratio < 11.3 ASSESSMENT / PLAN / RECOMMENDATIONS:   1) Type 2 Diabetes Mellitus, ***controlled, With out complications - Most recent A1c of *** %. Goal A1c < 7.0 %.    Plan: MEDICATIONS: ***  EDUCATION / INSTRUCTIONS: BG monitoring instructions: Patient is instructed to check his blood sugars *** times a day, ***. Call [facility] if: BG persistently < 70  I reviewed the Rule of 15 for the treatment of hypoglycemia in detail with the patient. Literature supplied.    2) Diabetic complications:  Eye: Does not have known diabetic retinopathy.  Neuro/ Feet: Does not have known diabetic peripheral neuropathy .  Renal: Patient does not have known baseline CKD. He   is not on an ACEI/ARB at present.     3) Dyslipidemia :      - Discused cardiovascular benefits of statins , and I have strongly recommended statins, will consider starting a statin on next visit .    F/U in ***    Signed electronically by: Lyndle Herrlich, MD  [facility] Endocrinology  [facility] [address] [place], [place] [address] Phone:  [phone] FAX: [phone]   CC: Blair Heys, MD [address] [place] [address] Phone: [phone]  Fax: [phone]  Return to Endocrinology clinic as below: Future Appointments  Date Time Provider Department Center  03/03/2021 11:10 AM Takuma Cifelli, Konrad Dolores, MD [id number] None

## 2021-07-16 ENCOUNTER — Other Ambulatory Visit: Payer: Self-pay | Admitting: Internal Medicine

## 2021-07-31 ENCOUNTER — Other Ambulatory Visit: Payer: Self-pay | Admitting: Internal Medicine

## 2021-09-08 ENCOUNTER — Emergency Department (HOSPITAL_BASED_OUTPATIENT_CLINIC_OR_DEPARTMENT_OTHER): Payer: BC Managed Care – PPO | Admitting: Radiology

## 2021-09-08 ENCOUNTER — Other Ambulatory Visit (HOSPITAL_BASED_OUTPATIENT_CLINIC_OR_DEPARTMENT_OTHER): Payer: Self-pay

## 2021-09-08 ENCOUNTER — Other Ambulatory Visit: Payer: Self-pay

## 2021-09-08 ENCOUNTER — Emergency Department (HOSPITAL_BASED_OUTPATIENT_CLINIC_OR_DEPARTMENT_OTHER)
Admission: EM | Admit: 2021-09-08 | Discharge: 2021-09-08 | Disposition: A | Discharge status: Home / Self Care | Payer: BC Managed Care – PPO | Attending: Emergency Medicine | Admitting: Emergency Medicine

## 2021-09-08 ENCOUNTER — Encounter (HOSPITAL_BASED_OUTPATIENT_CLINIC_OR_DEPARTMENT_OTHER): Payer: Self-pay | Admitting: Emergency Medicine

## 2021-09-08 DIAGNOSIS — E119 Type 2 diabetes mellitus without complications: Secondary | ICD-10-CM | POA: Insufficient documentation

## 2021-09-08 DIAGNOSIS — R079 Chest pain, unspecified: Secondary | ICD-10-CM

## 2021-09-08 DIAGNOSIS — R0789 Other chest pain: Secondary | ICD-10-CM | POA: Diagnosis not present

## 2021-09-08 DIAGNOSIS — F419 Anxiety disorder, unspecified: Secondary | ICD-10-CM | POA: Insufficient documentation

## 2021-09-08 DIAGNOSIS — Z7984 Long term (current) use of oral hypoglycemic drugs: Secondary | ICD-10-CM | POA: Diagnosis not present

## 2021-09-08 DIAGNOSIS — R072 Precordial pain: Secondary | ICD-10-CM | POA: Insufficient documentation

## 2021-09-08 LAB — CBC
HCT: 44.2 % (ref 39.0–52.0)
Hemoglobin: 15.4 g/dL (ref 13.0–17.0)
MCH: 29.6 pg (ref 26.0–34.0)
MCHC: 34.8 g/dL (ref 30.0–36.0)
MCV: 84.8 fL (ref 80.0–100.0)
Platelets: 292 10*3/uL (ref 150–400)
RBC: 5.21 MIL/uL (ref 4.22–5.81)
RDW: 11.9 % (ref 11.5–15.5)
WBC: 7.1 10*3/uL (ref 4.0–10.5)
nRBC: 0 % (ref 0.0–0.2)

## 2021-09-08 LAB — BASIC METABOLIC PANEL
Anion gap: 9 (ref 5–15)
BUN: 10 mg/dL (ref 6–20)
CO2: 27 mmol/L (ref 22–32)
Calcium: 9.7 mg/dL (ref 8.9–10.3)
Chloride: 99 mmol/L (ref 98–111)
Creatinine, Ser: 1.16 mg/dL (ref 0.61–1.24)
GFR, Estimated: >60 mL/min (ref >60)
Glucose, Bld: 301 mg/dL — ABNORMAL HIGH (ref 70–99)
Potassium: 3.9 mmol/L (ref 3.5–5.1)
Sodium: 135 mmol/L (ref 135–145)

## 2021-09-08 LAB — TROPONIN I (HIGH SENSITIVITY)
Troponin I (High Sensitivity): 4 ng/L (ref <18)
Troponin I (High Sensitivity): 4 ng/L (ref <18)

## 2021-09-08 MED ORDER — GLIPIZIDE 10 MG PO TABS
20.0000 mg | ORAL_TABLET | Freq: Two times a day (BID) | ORAL | 0 refills | Status: DC
  Filled 2021-09-08: qty 120, 30d supply, fill #0

## 2021-09-08 MED ORDER — VICTOZA 18 MG/3ML ~~LOC~~ SOPN
1.8000 mg | PEN_INJECTOR | Freq: Every day | SUBCUTANEOUS | 3 refills | Status: DC
  Filled 2021-09-08: qty 9, 30d supply, fill #0

## 2021-09-08 NOTE — ED Provider Notes (Cosign Needed)
[facility] EMERGENCY DEPT Provider Note   CSN: [id number] Arrival date & time: 09/08/21  1127     History  Chief Complaint  Patient presents with   Chest Pain   Anxiety    Devin Boyd is a 46 y.o. male.  Patient with history of diabetes presents today with complaints of chest pain. He states that yesterday he found out that his wife of 65 years has been having an affair. He states that his symptoms began immediately after he got this news. The pain is substernal in nature. He states that the pain comes on suddenly and is intermittent in nature, occurring after times when he thinks about the affair. It is dull in nature and only lasts a few seconds at a time. It does not radiate and is without any shortness of breath, nausea, vomiting, or diaphoresis. He does not smoke, drink alcohol, and denies any recreational drug use or IVDU. Patient also states that he has not been taking his diabetes medications for the past 2 days because he ran out and hasnt had time to see his PCP to get a refill. He denies any history of heart problems. He denies SI/HI, visual/auditory hallucinations. He has never felt pain like this before. He denies leg pain, leg swelling, recent travel, or recent surgeries, or history of malignancy or blood clots.  The history is provided by the patient. No language interpreter was used.  Chest Pain Associated symptoms: anxiety   Associated symptoms: no dizziness, no fever, no headache, no nausea, no numbness, no shortness of breath, no vomiting and no weakness   Anxiety Associated symptoms include chest pain. Pertinent negatives include no headaches and no shortness of breath.      Home Medications Prior to Admission medications   Medication Sig Start Date End Date Taking? Authorizing Provider  Continuous Blood Gluc Sensor (DEXCOM G6 SENSOR) MISC 1 Device by Does not apply route as directed. 12/01/20   Shamleffer, Melanie Crazier, MD  Continuous Blood  Gluc Transmit (DEXCOM G6 TRANSMITTER) MISC 1 Device by Does not apply route as directed. 12/01/20   Shamleffer, Melanie Crazier, MD  glipiZIDE (GLUCOTROL) 10 MG tablet TAKE 2 TABLETS (20 MG TOTAL) BY MOUTH 2 (TWO) TIMES DAILY BEFORE A MEAL. 07/17/21   Shamleffer, Melanie Crazier, MD  naproxen (NAPROSYN) 500 MG tablet Take 1 tablet (500 mg total) by mouth 2 (two) times daily. Patient not taking: Reported on 12/01/2020 08/05/17   Petrucelli, Samantha R, PA-C  VICTOZA 18 MG/3ML SOPN Inject 1.8 mg into the skin daily. 12/01/20   Shamleffer, Melanie Crazier, MD      Allergies    Metformin hcl    Review of Systems   Review of Systems  Constitutional:  Negative for chills and fever.  Respiratory:  Negative for shortness of breath.   Cardiovascular:  Positive for chest pain.  Gastrointestinal:  Negative for nausea and vomiting.  Neurological:  Negative for dizziness, tremors, seizures, syncope, facial asymmetry, speech difficulty, weakness, light-headedness, numbness and headaches.  Psychiatric/Behavioral:  Negative for agitation, behavioral problems, confusion, decreased concentration, dysphoric mood, hallucinations, self-injury and suicidal ideas. The patient is not nervous/anxious and is not hyperactive.   All other systems reviewed and are negative.  Physical Exam Updated Vital Signs BP (!) 155/93    Pulse 84    Temp 98.3 F (36.8 C)    Resp (!) 21    Ht 5\' 8"  (1.727 m)    Wt 115.8 kg    SpO2 99%  BMI 38.82 kg/m  Physical Exam Vitals and nursing note reviewed.  Constitutional:      General: He is not in acute distress.    Appearance: He is well-developed. He is obese. He is not ill-appearing, toxic-appearing or diaphoretic.     Comments: Patient resting comfortably in bed in no acute distress  HENT:     Head: Normocephalic and atraumatic.  Eyes:     Extraocular Movements: Extraocular movements intact.     Pupils: Pupils are equal, round, and reactive to light.  Cardiovascular:     Rate  and Rhythm: Normal rate and regular rhythm.     Pulses:          Radial pulses are 2+ on the right side and 2+ on the left side.       Dorsalis pedis pulses are 2+ on the right side and 2+ on the left side.       Posterior tibial pulses are 2+ on the right side and 2+ on the left side.     Heart sounds: Normal heart sounds.  Pulmonary:     Effort: Pulmonary effort is normal.     Breath sounds: Normal breath sounds.  Chest:     Chest wall: No tenderness.  Abdominal:     Palpations: Abdomen is soft.  Musculoskeletal:        General: Normal range of motion.     Cervical back: Normal range of motion and neck supple.     Right lower leg: No tenderness. No edema.     Left lower leg: No tenderness. No edema.  Skin:    General: Skin is warm and dry.  Neurological:     General: No focal deficit present.     Mental Status: He is alert.  Psychiatric:        Mood and Affect: Mood normal.        Behavior: Behavior normal.    ED Results / Procedures / Treatments   Labs (all labs ordered are listed, but only abnormal results are displayed) Labs Reviewed  BASIC METABOLIC PANEL - Abnormal; Notable for the following components:      Result Value   Glucose, Bld 301 (*)    All other components within normal limits  CBC  TROPONIN I (HIGH SENSITIVITY)  TROPONIN I (HIGH SENSITIVITY)    EKG EKG Interpretation  Date/Time:  Friday September 08 2021 11:37:26 EST Ventricular Rate:  96 PR Interval:  146 QRS Duration: 78 QT Interval:  366 QTC Calculation: 462 R Axis:   -53 Text Interpretation: Normal sinus rhythm Left anterior fascicular block Abnormal ECG When compared with ECG of 30-Jun-2015 10:47, PREVIOUS ECG IS PRESENT Confirmed by Thamas Jaegers ([id number]) on 09/08/2021 1:11:52 PM  Radiology DG Chest 2 View  Result Date: 09/08/2021 CLINICAL DATA:  Chest pain EXAM: CHEST - 2 VIEW COMPARISON:  08/05/2017 FINDINGS: The heart size and mediastinal contours are within normal limits. Both lungs are  clear. The visualized skeletal structures are unremarkable. IMPRESSION: No active cardiopulmonary disease. Electronically Signed   By: Elmer Picker M.D.   On: 09/08/2021 12:03    Procedures Procedures    Medications Ordered in ED Medications - No data to display  ED Course/ Medical Decision Making/ A&P                           Medical Decision Making Amount and/or Complexity of Data Reviewed Labs: ordered. Radiology: ordered.  Risk Prescription drug management.  This patient presents to the ED for concern of chest pain, this involves an extensive number of treatment options, and is a complaint that carries with it a high risk of complications and morbidity.    Co morbidities that complicate the patient evaluation  diabetes    Lab Tests:  I Ordered, and personally interpreted labs.  The pertinent results include:  glucose 300, no other acute laboratory findings   Imaging Studies ordered:  I ordered imaging studies including CXR  I independently visualized and interpreted imaging which showed no active cardiopulmonary disease I agree with the radiologist interpretation   Cardiac Monitoring:  The patient was maintained on a cardiac monitor.  I personally viewed and interpreted the cardiac monitored which showed an underlying rhythm of: sinus rhythm  Given the large differential diagnosis for Devin Boyd, the decision making in this case is of high complexity.  After evaluating all of the data points in this case, the presentation of Devin Boyd is NOT consistent with Acute Coronary Syndrome (ACS) and/or myocardial ischemia, pulmonary embolism, aortic dissection; Devin Boyd, significant arrythmia, pneumothorax, cardiac tamponade, or other emergent cardiopulmonary condition.  Further, the presentation of Devin Boyd is NOT consistent with pericarditis, myocarditis, mediastinitis, endocarditis, new valvular disease.  Additionally, the presentation of  Devin Boyd NOT consistent with flail chest, cardiac contusion, ARDS, or significant intra-thoracic or intra-abdominal bleeding.  Moreover, this presentation is NOT consistent with pneumonia or sepsis  The patient has a   heart score of 1, low risk. Given that patient's symptoms only presented following a stressful event and only occur when he is thinking about the stressful event, and without any remarkable laboratory findings, I suspect that patient's symptoms are stress related.  He is afebrile, nontoxic-appearing, and in no acute distress with reassuring vital signs.  He also denies any SI/HI at this time.  He is stable for discharge at this time.  His blood sugar was elevated today without any signs of DKA or HHS.  I have refilled his diabetes medications today and emphasized the importance of following up with his PCP for continued management of both his mental health his chest pain and his diabetes.  I have also given him information for the behavioral health urgent care where he can go for help with depression.  He is understanding and amenable with plan, educated on red flag symptoms of prompt immediate return.  Discharged in stable condition.   Strict return and follow-up precautions have been given by me personally or by detailed written instruction given verbally by nursing staff using the teach back method to the patient/family/caregiver(s).  Data Reviewed/Counseling: I have reviewed the patient's vital signs, nursing notes, and other relevant tests/information. I had a detailed discussion regarding the historical points, exam findings, and any diagnostic results supporting the discharge diagnosis. I also discussed the need for outpatient follow-up and the need to return to the ED if symptoms worsen or if there are any questions or concerns that arise at home.  Findings and plan of care discussed with supervising physician Dr. Almyra Free who is in agreement.    Final Clinical Impression(s)  / ED Diagnoses Final diagnoses:  Chest pain, unspecified type    Rx / DC Orders ED Discharge Orders          Ordered    VICTOZA 18 MG/3ML SOPN  Daily        09/08/21 1455    glipiZIDE (GLUCOTROL) 10 MG tablet  2 times daily before meals  Note to Pharmacy: Pt needs appt for further refills   09/08/21 1455          An After Visit Summary was printed and given to the patient.     Nestor Lewandowsky 09/08/21 2139

## 2021-09-08 NOTE — ED Triage Notes (Signed)
Pt arrives to ED with c/o chest pain. The CP started yesterday afternoon. The CP is intermittent and substernal. The pain last just a few seconds. The pain is described as dull. Associated symptoms include SOB, generalized abdominal pain. Pt reports major life event yesterday that has increased his anxiety. He reports wanting resources for his current mental health.  ?

## 2021-09-08 NOTE — Discharge Instructions (Addendum)
As we discussed, your work-up today was reassuring for acute abnormalities. I have refilled your glipizide for your diabetes, please take this as prescribed.  It is also extremely important that you follow-up with a primary care doctor for continued management of your diabetes.  I have also given you a referral to the behavioral health urgent care where you can go to discuss concerns with your mental health. ? ?Return if development of any new or worsening symptoms. ?

## 2021-09-08 NOTE — ED Notes (Signed)
Patient verbalizes understanding of discharge instructions. Opportunity for questioning and answers were provided. Patient discharged from ED.  °

## 2021-10-26 ENCOUNTER — Other Ambulatory Visit: Payer: Self-pay | Admitting: Internal Medicine

## 2021-12-14 ENCOUNTER — Telehealth: Payer: Self-pay | Admitting: Pharmacy Technician

## 2021-12-14 ENCOUNTER — Other Ambulatory Visit (HOSPITAL_COMMUNITY): Payer: Self-pay

## 2021-12-14 NOTE — Telephone Encounter (Signed)
Patient Advocate Encounter   Received notification from CoverMyMeds that prior authorization for Dexcom is due for renewal.   Last PA expired 12/12/20  Per test claim no PA needed.

## 2022-01-18 ENCOUNTER — Encounter (HOSPITAL_BASED_OUTPATIENT_CLINIC_OR_DEPARTMENT_OTHER): Payer: Self-pay | Admitting: Obstetrics and Gynecology

## 2022-01-18 ENCOUNTER — Other Ambulatory Visit (HOSPITAL_BASED_OUTPATIENT_CLINIC_OR_DEPARTMENT_OTHER): Payer: Self-pay

## 2022-01-18 ENCOUNTER — Other Ambulatory Visit: Payer: Self-pay

## 2022-01-18 ENCOUNTER — Emergency Department (HOSPITAL_BASED_OUTPATIENT_CLINIC_OR_DEPARTMENT_OTHER)
Admission: EM | Admit: 2022-01-18 | Discharge: 2022-01-18 | Disposition: A | Discharge status: Home / Self Care | Payer: BC Managed Care – PPO | Attending: Emergency Medicine | Admitting: Emergency Medicine

## 2022-01-18 DIAGNOSIS — R0981 Nasal congestion: Secondary | ICD-10-CM

## 2022-01-18 DIAGNOSIS — E1165 Type 2 diabetes mellitus with hyperglycemia: Secondary | ICD-10-CM | POA: Diagnosis not present

## 2022-01-18 DIAGNOSIS — R739 Hyperglycemia, unspecified: Secondary | ICD-10-CM

## 2022-01-18 DIAGNOSIS — Z91148 Patient's other noncompliance with medication regimen for other reason: Secondary | ICD-10-CM

## 2022-01-18 DIAGNOSIS — Z7984 Long term (current) use of oral hypoglycemic drugs: Secondary | ICD-10-CM | POA: Diagnosis not present

## 2022-01-18 LAB — CBG MONITORING, ED: Glucose-Capillary: 358 mg/dL — ABNORMAL HIGH (ref 70–99)

## 2022-01-18 MED ORDER — VICTOZA 18 MG/3ML ~~LOC~~ SOPN
1.8000 mg | PEN_INJECTOR | Freq: Every day | SUBCUTANEOUS | 3 refills | Status: DC
  Filled 2022-01-18: qty 9, 30d supply, fill #0

## 2022-01-18 MED ORDER — GLIPIZIDE 10 MG PO TABS
20.0000 mg | ORAL_TABLET | Freq: Two times a day (BID) | ORAL | 0 refills | Status: DC
  Filled 2022-01-18: qty 120, 30d supply, fill #0

## 2022-01-18 NOTE — ED Triage Notes (Signed)
Patient reports to the ER for x3 days of congestion and now reports ringing in the right ear. Reports he has been trying to take OTC medication without relief.

## 2022-01-18 NOTE — ED Provider Notes (Signed)
[facility] EMERGENCY DEPT Provider Note   CSN: [id number] Arrival date & time: 01/18/22  0820     History  Chief Complaint  Patient presents with   Nasal Congestion   Tinnitus    Devin Boyd is a 46 y.o. male.  HPI Patient presents with nasal congestion.  Has had for a few days now.  Started on Sunday.  Unsure if it was from potentially dust from working on a vacuum or potentially being out with some grass that was being mowed.  Is allergic to both of them.  Has had congestion in his nose.  No relief with Sudafed.  States feels feel of little fullness in his right ear.  Sounds a little muffled and will have some ringing at times.  No fevers. Also been out of his diabetic medicines.  States that has been for few days now.  States he is in between providers.  States he is on Glucotrol and Victoza.  No headache.  No confusion.    Home Medications Prior to Admission medications   Medication Sig Start Date End Date Taking? Authorizing Provider  Continuous Blood Gluc Sensor (DEXCOM G6 SENSOR) MISC 1 Device by Does not apply route as directed. 12/01/20   Shamleffer, Konrad Dolores, MD  Continuous Blood Gluc Transmit (DEXCOM G6 TRANSMITTER) MISC 1 Device by Does not apply route as directed. 12/01/20   Shamleffer, Konrad Dolores, MD  glipiZIDE (GLUCOTROL) 10 MG tablet Take 2 tablets (20 mg total) by mouth 2 (two) times daily before a meal. 01/18/22   Benjiman Core, MD  naproxen (NAPROSYN) 500 MG tablet Take 1 tablet (500 mg total) by mouth 2 (two) times daily. Patient not taking: Reported on 12/01/2020 08/05/17   Petrucelli, Samantha R, PA-C  VICTOZA 18 MG/3ML SOPN Inject 1.8 mg into the skin daily. 01/18/22   Benjiman Core, MD      Allergies    Metformin hcl    Review of Systems   Review of Systems  Physical Exam Updated Vital Signs BP 137/86   Pulse 86   Temp 98.3 F (36.8 C) (Oral)   Resp 16   Ht 5\' 8"  (1.727 m)   Wt 117.9 kg   SpO2 95%   BMI 39.53  kg/m  Physical Exam Vitals and nursing note reviewed.  HENT:     Ears:     Comments: Bilateral TMs reassuring.  No bulging.  No fluid.  No erythema.    Nose: Congestion present.  Cardiovascular:     Rate and Rhythm: Regular rhythm.  Musculoskeletal:     Cervical back: Neck supple.  Neurological:     Mental Status: He is alert.     ED Results / Procedures / Treatments   Labs (all labs ordered are listed, but only abnormal results are displayed) Labs Reviewed  CBG MONITORING, ED - Abnormal; Notable for the following components:      Result Value   Glucose-Capillary 358 (*)    All other components within normal limits    EKG None  Radiology No results found.  Procedures Procedures    Medications Ordered in ED Medications - No data to display  ED Course/ Medical Decision Making/ A&P                           Medical Decision Making Risk Prescription drug management.   Patient presents for nasal congestion and some ringing/fullness in his right ear.  Has had for a few days  now.  No relief with over-the-counter medications.  Has taken some decongestants.  Ears reassuring.  Does not appear to be infection.  Some mild nasal congestion.  Will treat symptomatically.  However patient is also diabetic and is off his medicines.  Patient off his Victoza and Glucotrol.  Hyperglycemia with sugar above 300.  Discussed with patient about further treatment here.  He is going to give fluid and attempt bring the sugar down since he does not have medicines at home.  States he has to go to work.  I think this is somewhat reasonable also.  Doubt patient is in DKA but would rather done more work-up.  Given prescriptions for refill and has information for more follow-up.  Will discharge home.        Final Clinical Impression(s) / ED Diagnoses Final diagnoses:  Nasal congestion  Hyperglycemia  Noncompliance with medications    Rx / DC Orders ED Discharge Orders          Ordered     VICTOZA 18 MG/3ML SOPN  Daily        01/18/22 0906    glipiZIDE (GLUCOTROL) 10 MG tablet  2 times daily before meals       Note to Pharmacy: Pt needs appt for further refills   01/18/22 0906              Benjiman Core, MD 01/18/22 [phone]

## 2022-01-18 NOTE — ED Notes (Signed)
Discharge instructions and medications reviewed and explained, pt verbalized understanding and had no further questions on departure.

## 2022-02-22 ENCOUNTER — Ambulatory Visit (INDEPENDENT_AMBULATORY_CARE_PROVIDER_SITE_OTHER): Payer: BC Managed Care – PPO | Admitting: Family Medicine

## 2022-02-22 ENCOUNTER — Encounter (HOSPITAL_BASED_OUTPATIENT_CLINIC_OR_DEPARTMENT_OTHER): Payer: Self-pay | Admitting: Family Medicine

## 2022-02-22 ENCOUNTER — Other Ambulatory Visit (HOSPITAL_BASED_OUTPATIENT_CLINIC_OR_DEPARTMENT_OTHER): Payer: Self-pay

## 2022-02-22 DIAGNOSIS — H9311 Tinnitus, right ear: Secondary | ICD-10-CM | POA: Insufficient documentation

## 2022-02-22 DIAGNOSIS — E785 Hyperlipidemia, unspecified: Secondary | ICD-10-CM

## 2022-02-22 DIAGNOSIS — E1165 Type 2 diabetes mellitus with hyperglycemia: Secondary | ICD-10-CM | POA: Diagnosis not present

## 2022-02-22 DIAGNOSIS — E119 Type 2 diabetes mellitus without complications: Secondary | ICD-10-CM | POA: Insufficient documentation

## 2022-02-22 MED ORDER — FLUTICASONE PROPIONATE 50 MCG/ACT NA SUSP
1.0000 | Freq: Every day | NASAL | 1 refills | Status: AC
  Filled 2022-02-22: qty 16, 30d supply, fill #0

## 2022-02-22 MED ORDER — HYDROCORTISONE 2.5 % EX OINT
TOPICAL_OINTMENT | Freq: Two times a day (BID) | CUTANEOUS | 1 refills | Status: DC
  Filled 2022-02-22: qty 28.35, 14d supply, fill #0

## 2022-02-22 MED ORDER — TIRZEPATIDE 2.5 MG/0.5ML ~~LOC~~ SOAJ
2.5000 mg | SUBCUTANEOUS | 0 refills | Status: DC
  Filled 2022-02-22: qty 2, 28d supply, fill #0

## 2022-02-22 NOTE — Assessment & Plan Note (Signed)
Persistent issue for patient, no signs of infection today Recommend additional measures to address any underlying sinus congestion/allergic rhinitis.  Would recommend oral antihistamine, can consider Claritin as this has not been tried as of yet.  Can also utilize Flonase, recommend using at night.  Additionally can utilize nasal saline spray up to 2-3 times a day We will place referral to ENT for patient to establish for further evaluation and management.  Can further discuss symptoms and whether conservative measures thus far have provided any benefit

## 2022-02-22 NOTE — Assessment & Plan Note (Addendum)
We will check labs today including A1c, urine microalbumin/creatinine ratio, lipid panel.  Suspect that hemoglobin A1c is not at goal Can continue with glipizide at this time, we will look to restart patient on GLP-1 receptor agonist, discussed options.  We will look to start [facility], likely will need to obtain prior authorization through insurance.  Discussed medication, potential risks and side effects, need for gradual titration of medication.  Discussed that typically we will initiate medication at low-dose and gradually titrated as tolerated.  We will plan for follow-up about 1 month after patient is able to start [facility] or alternative GLP-1 receptor agonist. We additionally discussed possibility of initiating insulin, particularly if hemoglobin A1c is found to be notably elevated.  Discussed that this can be beneficial in more rapidly gaining control of blood sugars and lowering risk related to hypoglycemia.  Patient did indicate that he may be amenable to this despite having resistance to insulin therapy in the past Recommend that he contact endocrinology office to schedule follow-up as he would likely benefit from additional assistance with managing diabetes due to poor control At future visits, we will complete foot exam, ensure an ophthalmology evaluation for diabetic retinopathy, reviewed pneumococcal vaccination

## 2022-02-22 NOTE — Progress Notes (Signed)
New Patient Office Visit  Subjective    Patient ID: Devin Boyd, male    DOB: 1975/09/21  Age: 46 y.o. MRN: [id number]  CC:  Chief Complaint  Patient presents with   New Patient (Initial Visit)    Pt here to establish new care, pt also has a concern with his right ear, pt need refills on medications     HPI Devin Boyd presents to establish care Last PCP - none, had a clinic at work [employer]), but has not had a PCP otherwise  DM: Initially diagnosed years ago.  He was on metformin at 1 point, however had issues with diarrhea and was not able to tolerate so medication was discontinued.  Due to diabetes being poorly controlled, he did establish with endocrinologist in May 2022.  At that time he was started on glipizide and Victoza.  He was due to follow-up a few months after that establishing visit, however did not follow-up at that time.  He more recently has been out of medications, however did have recent visit to the emergency department and was provided refill of glipizide which he has been taking for the past few weeks.  He still has not started back on Victoza however.  Did have elevated blood sugars while in the emergency department when it was greater than 300.  Has not had recent hemoglobin A1c and when last checked it was not at goal.  He has not been noticing any weight loss, no polyuria or polydipsia.  In the past, insulin was discussed with patient by endocrinologist, however he did not want to start medication at that time  Right ear: decreased hearing, some tinnitus. Started about 1 month ago, was seen at ED, no evidence of infection at that time. Was felt to be related to some sinus congestion/allergic rhinitis that she was experiencing.  He was recommended to start on antihistamine.  He does continue to have symptoms.  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  Did also utilize Nasacort at 1 point, not currently utilizing.  Reports that he has been having  some mental health concerns with the passing of his mother and brother. With this and also some reported marital issues he felt "checked out" and had some underlying apathy. Has not had any specific evaluation or treatment in the past.  Patient is originally from [place]. 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 Outside of work, he enjoys bowling, going to movies, playing pool.  Outpatient Encounter Medications as of 02/22/2022  Medication Sig   Continuous Blood Gluc Sensor (DEXCOM G6 SENSOR) MISC 1 Device by Does not apply route as directed.   Continuous Blood Gluc Transmit (DEXCOM G6 TRANSMITTER) MISC 1 Device by Does not apply route as directed.   fluticasone (FLONASE) 50 MCG/ACT nasal spray Place 1 spray into both nostrils daily.   glipiZIDE (GLUCOTROL) 10 MG tablet Take 2 tablets (20 mg total) by mouth 2 (two) times daily before a meal.   hydrocortisone 2.5 % ointment Apply topically 2 (two) times daily.   naproxen (NAPROSYN) 500 MG tablet Take 1 tablet (500 mg total) by mouth 2 (two) times daily.   tirzepatide [facility]) 2.5 MG/0.5ML Pen Inject 2.5 mg into the skin once a week.   [DISCONTINUED] VICTOZA 18 MG/3ML SOPN Inject 1.8 mg into the skin daily.   No facility-administered encounter medications on file as of 02/22/2022.    Past Medical History:  Diagnosis Date   Diabetes mellitus without complication (HCC)     History reviewed. No pertinent surgical history.  Family History  Problem Relation Age of Onset   Diabetes Mother    Diabetes Father     Social History   Socioeconomic History   Marital status: Married    Spouse name: Not on file   Number of children: Not on file   Years of education: Not on file   Highest education level: Not on file  Occupational History   Not on file  Tobacco Use   Smoking status: Never    Passive exposure: Never   Smokeless tobacco: Never  Vaping Use    Vaping Use: Never used  Substance and Sexual Activity   Alcohol use: Yes    Comment: Social   Drug use: No   Sexual activity: Yes  Other Topics Concern   Not on file  Social History Narrative   Not on file   Social Determinants of Health   Financial Resource Strain: Not on file  Food Insecurity: Not on file  Transportation Needs: Not on file  Physical Activity: Not on file  Stress: Not on file  Social Connections: Not on file  Intimate Partner Violence: Not on file    Objective    BP (!) 133/92   Pulse 72   Ht 5\' 8"  (1.727 m)   Wt 269 lb 6.4 oz (122.2 kg)   SpO2 95%   BMI 40.96 kg/m   Physical Exam  46 year old male in no acute distress Cardiovascular Sam with regular rate and rhythm, no murmur appreciated Lungs clear to auscultation bilaterally Left ear with clear auditory canal, normal-appearing tabetic membrane, no bulging.  Right auditory canal with no acute findings, normal-appearing tympanic membrane, no bulging observed.  Assessment & Plan:   Problem List Items Addressed This Visit       Endocrine   Diabetes mellitus (HCC)    We will check labs today including A1c, urine microalbumin/creatinine ratio, lipid panel.  Suspect that hemoglobin A1c is not at goal Can continue with glipizide at this time, we will look to restart patient on GLP-1 receptor agonist, discussed options.  We will look to start Lifescape, likely will need to obtain prior authorization through insurance.  Discussed medication, potential risks and side effects, need for gradual titration of medication.  Discussed that typically we will initiate medication at low-dose and gradually titrated as tolerated.  We will plan for follow-up about 1 month after patient is able to start [facility] or alternative GLP-1 receptor agonist. We additionally discussed possibility of initiating insulin, particularly if hemoglobin A1c is found to be notably elevated.  Discussed that this can be beneficial in more  rapidly gaining control of blood sugars and lowering risk related to hypoglycemia.  Patient did indicate that he may be amenable to this despite having resistance to insulin therapy in the past Recommend that he contact endocrinology office to schedule follow-up as he would likely benefit from additional assistance with managing diabetes due to poor control At future visits, we will complete foot exam, ensure an ophthalmology evaluation for diabetic retinopathy, reviewed pneumococcal vaccination      Relevant Medications   tirzepatide [facility]) 2.5 MG/0.5ML Pen   Other Relevant Orders   Hemoglobin A1c   Lipid panel   Urine Microalbumin w/creat. ratio     Other   Dyslipidemia    We will plan to update cholesterol panel at this time, to assess current risk and need to initiate  statin therapy      Relevant Orders   Lipid panel   Tinnitus, right ear    Persistent issue for patient, no signs of infection today Recommend additional measures to address any underlying sinus congestion/allergic rhinitis.  Would recommend oral antihistamine, can consider Claritin as this has not been tried as of yet.  Can also utilize Flonase, recommend using at night.  Additionally can utilize nasal saline spray up to 2-3 times a day We will place referral to ENT for patient to establish for further evaluation and management.  Can further discuss symptoms and whether conservative measures thus far have provided any benefit      Relevant Orders   Ambulatory referral to ENT    Return if symptoms worsen or fail to improve.   Shealyn Sean J De Peru, MD

## 2022-02-22 NOTE — Assessment & Plan Note (Signed)
We will plan to update cholesterol panel at this time, to assess current risk and need to initiate statin therapy

## 2022-02-22 NOTE — Patient Instructions (Signed)
  Medication Instructions:  Your physician recommends that you continue on your current medications as directed. Please refer to the Current Medication list given to you today. --If you need a refill on any your medications before your next appointment, please call your pharmacy first. If no refills are authorized on file call the office.-- Lab Work: Your physician has recommended that you have lab work today: Yes If you have labs (blood work) drawn today and your tests are completely normal, you will receive your results via MyChart message OR a phone call from our staff.  Please ensure you check your voicemail in the event that you authorized detailed messages to be left on a delegated number. If you have any lab test that is abnormal or we need to change your treatment, we will call you to review the results.  Referrals/Procedures/Imaging: No  Follow-Up: Your next appointment:   Your physician recommends that you schedule a follow-up appointment as needed with Dr. de Cuba.  You will receive a text message or e-mail with a link to a survey about your care and experience with us today! We would greatly appreciate your feedback!   Thanks for letting us be apart of your health journey!!  Primary Care and Sports Medicine   Dr. Raymond de Cuba   We encourage you to activate your patient portal called "MyChart".  Sign up information is provided on this After Visit Summary.  MyChart is used to connect with patients for Virtual Visits (Telemedicine).  Patients are able to view lab/test results, encounter notes, upcoming appointments, etc.  Non-urgent messages can be sent to your provider as well. To learn more about what you can do with MyChart, please visit --  [email or web address].    

## 2022-02-23 ENCOUNTER — Other Ambulatory Visit (HOSPITAL_BASED_OUTPATIENT_CLINIC_OR_DEPARTMENT_OTHER): Payer: Self-pay

## 2022-02-24 LAB — MICROALBUMIN / CREATININE URINE RATIO
Creatinine, Urine: 133.7 mg/dL
Microalb/Creat Ratio: 11 mg/g creat (ref 0–29)
Microalbumin, Urine: 14.6 ug/mL

## 2022-02-24 LAB — LIPID PANEL
Chol/HDL Ratio: 6.1 ratio — ABNORMAL HIGH (ref 0.0–5.0)
Cholesterol, Total: 237 mg/dL — ABNORMAL HIGH (ref 100–199)
HDL: 39 mg/dL — ABNORMAL LOW (ref >39)
LDL Chol Calc (NIH): 171 mg/dL — ABNORMAL HIGH (ref 0–99)
Triglycerides: 147 mg/dL (ref 0–149)
VLDL Cholesterol Cal: 27 mg/dL (ref 5–40)

## 2022-02-24 LAB — HEMOGLOBIN A1C
Est. average glucose Bld gHb Est-mCnc: 280 mg/dL
Hgb A1c MFr Bld: 11.4 % — ABNORMAL HIGH (ref 4.8–5.6)

## 2022-02-28 NOTE — Progress Notes (Signed)
LVM on 8/23 @ 9:57 for pt to cb to sch- 2 to 3 weeks to further discuss management of this  hemoglobin A1c elevation.

## 2022-03-07 ENCOUNTER — Other Ambulatory Visit (HOSPITAL_BASED_OUTPATIENT_CLINIC_OR_DEPARTMENT_OTHER): Payer: Self-pay | Admitting: Family Medicine

## 2022-03-07 ENCOUNTER — Other Ambulatory Visit (HOSPITAL_BASED_OUTPATIENT_CLINIC_OR_DEPARTMENT_OTHER): Payer: Self-pay

## 2022-03-07 MED ORDER — GLIPIZIDE 10 MG PO TABS
20.0000 mg | ORAL_TABLET | Freq: Two times a day (BID) | ORAL | 0 refills | Status: DC
  Filled 2022-03-07: qty 120, 30d supply, fill #0

## 2022-03-13 ENCOUNTER — Other Ambulatory Visit (HOSPITAL_BASED_OUTPATIENT_CLINIC_OR_DEPARTMENT_OTHER): Payer: Self-pay

## 2022-04-11 ENCOUNTER — Ambulatory Visit (HOSPITAL_BASED_OUTPATIENT_CLINIC_OR_DEPARTMENT_OTHER): Payer: BC Managed Care – PPO | Admitting: Family Medicine

## 2022-04-18 ENCOUNTER — Other Ambulatory Visit (HOSPITAL_BASED_OUTPATIENT_CLINIC_OR_DEPARTMENT_OTHER): Payer: Self-pay

## 2022-04-18 ENCOUNTER — Ambulatory Visit (HOSPITAL_BASED_OUTPATIENT_CLINIC_OR_DEPARTMENT_OTHER): Payer: BC Managed Care – PPO | Admitting: Family Medicine

## 2022-04-18 ENCOUNTER — Encounter (HOSPITAL_BASED_OUTPATIENT_CLINIC_OR_DEPARTMENT_OTHER): Payer: Self-pay | Admitting: Family Medicine

## 2022-04-18 VITALS — BP 137/96 | HR 70 | Ht 68.0 in | Wt 273.8 lb

## 2022-04-18 DIAGNOSIS — E785 Hyperlipidemia, unspecified: Secondary | ICD-10-CM

## 2022-04-18 DIAGNOSIS — E1165 Type 2 diabetes mellitus with hyperglycemia: Secondary | ICD-10-CM

## 2022-04-18 DIAGNOSIS — H9311 Tinnitus, right ear: Secondary | ICD-10-CM | POA: Diagnosis not present

## 2022-04-18 MED ORDER — TIRZEPATIDE 5 MG/0.5ML ~~LOC~~ SOAJ
5.0000 mg | SUBCUTANEOUS | 1 refills | Status: DC
  Filled 2022-04-18: qty 6, 84d supply, fill #0

## 2022-04-18 MED ORDER — GLIPIZIDE 10 MG PO TABS
20.0000 mg | ORAL_TABLET | Freq: Two times a day (BID) | ORAL | 1 refills | Status: DC
  Filled 2022-04-18 (×2): qty 250, 63d supply, fill #0
  Filled 2022-07-05: qty 250, 63d supply, fill #1

## 2022-04-18 NOTE — Assessment & Plan Note (Signed)
Noted on recent lipid panel, discussed these findings as well as consideration of beginning statin therapy.  He will continue working on lifestyle modifications and we will monitor cholesterol numbers and further discuss need for statin therapy in the future

## 2022-04-18 NOTE — Progress Notes (Signed)
    Procedures performed today:    None.  Independent interpretation of notes and tests performed by another provider:   None.  Brief History, Exam, Impression, and Recommendations:    BP (!) 137/96   Pulse 70   Ht 5\' 8"  (1.727 m)   Wt 273 lb 12.8 oz (124.2 kg)   SpO2 100%   BMI 41.63 kg/m   Diabetes mellitus ([place]) Patient reports that he has been taking glipizide 20 mg twice daily.  Tolerating medication well.  He was also able to start with [facility], has completed 4 doses of medication.  Has been tolerating medication without issue, no notable issues with nausea or vomiting.  He does feel that blood sugars have been better controlled, has been feeling better overall since last appointment. Hemoglobin A1c completed at last time of office visit was notably elevated greater than 11%, patient is aware of needing to better control this which I suspect that blood sugars have been improved with use of medications At this time we will continue with glipizide and we will titrate Mounjaro to 5 mg weekly.  Advised on potential side effects as well as ways to mitigate side effects We will plan to recheck hemoglobin A1c around next office visit in about 6 to 8 weeks We will plan to complete foot exam at that time as well He is unsure if he has received prior pneumococcal vaccination, does not appear so in records, declines proceeding with this today  Tinnitus, right ear Continues to have issues with this, previously we did place referral to ENT, he has not heard from their office, we have provided patient with contact information today so that he may reach out to schedule appointment  Hyperlipidemia Noted on recent lipid panel, discussed these findings as well as consideration of beginning statin therapy.  He will continue working on lifestyle modifications and we will monitor cholesterol numbers and further discuss need for statin therapy in the future  Return in about 8 weeks (around  06/13/2022) for DM. Recommend seasonal flu vaccine, declined today   ___________________________________________ Devin Gulley de [place], MD, ABFM, [facility] [place]

## 2022-04-18 NOTE — Assessment & Plan Note (Signed)
Continues to have issues with this, previously we did place referral to ENT, he has not heard from their office, we have provided patient with contact information today so that he may reach out to schedule appointment

## 2022-04-18 NOTE — Patient Instructions (Signed)
  Medication Instructions:  Your physician recommends that you continue on your current medications as directed. Please refer to the Current Medication list given to you today. --If you need a refill on any your medications before your next appointment, please call your pharmacy first. If no refills are authorized on file call the office.-- Lab Work: Your physician has recommended that you have lab work today: No If you have labs (blood work) drawn today and your tests are completely normal, you will receive your results via MyChart message OR a phone call from our staff.  Please ensure you check your voicemail in the event that you authorized detailed messages to be left on a delegated number. If you have any lab test that is abnormal or we need to change your treatment, we will call you to review the results.  Referrals/Procedures/Imaging: No  Follow-Up: Your next appointment:   Your physician recommends that you schedule a follow-up appointment in: 6-8 weeks with Dr. de Cuba.  You will receive a text message or e-mail with a link to a survey about your care and experience with us today! We would greatly appreciate your feedback!   Thanks for letting us be apart of your health journey!!  Primary Care and Sports Medicine   Dr. Raymond de Cuba   We encourage you to activate your patient portal called "MyChart".  Sign up information is provided on this After Visit Summary.  MyChart is used to connect with patients for Virtual Visits (Telemedicine).  Patients are able to view lab/test results, encounter notes, upcoming appointments, etc.  Non-urgent messages can be sent to your provider as well. To learn more about what you can do with MyChart, please visit --  [email or web address].    

## 2022-04-18 NOTE — Assessment & Plan Note (Signed)
Patient reports that he has been taking glipizide 20 mg twice daily.  Tolerating medication well.  He was also able to start with [facility], has completed 4 doses of medication.  Has been tolerating medication without issue, no notable issues with nausea or vomiting.  He does feel that blood sugars have been better controlled, has been feeling better overall since last appointment. Hemoglobin A1c completed at last time of office visit was notably elevated greater than 11%, patient is aware of needing to better control this which I suspect that blood sugars have been improved with use of medications At this time we will continue with glipizide and we will titrate Mounjaro to 5 mg weekly.  Advised on potential side effects as well as ways to mitigate side effects We will plan to recheck hemoglobin A1c around next office visit in about 6 to 8 weeks We will plan to complete foot exam at that time as well He is unsure if he has received prior pneumococcal vaccination, does not appear so in records, declines proceeding with this today

## 2022-07-05 ENCOUNTER — Other Ambulatory Visit (HOSPITAL_BASED_OUTPATIENT_CLINIC_OR_DEPARTMENT_OTHER): Payer: Self-pay

## 2022-07-05 ENCOUNTER — Ambulatory Visit (HOSPITAL_BASED_OUTPATIENT_CLINIC_OR_DEPARTMENT_OTHER): Payer: BC Managed Care – PPO | Admitting: Family Medicine

## 2022-07-05 ENCOUNTER — Encounter (HOSPITAL_BASED_OUTPATIENT_CLINIC_OR_DEPARTMENT_OTHER): Payer: Self-pay | Admitting: Family Medicine

## 2022-07-05 DIAGNOSIS — E1165 Type 2 diabetes mellitus with hyperglycemia: Secondary | ICD-10-CM | POA: Diagnosis not present

## 2022-07-05 LAB — HEMOGLOBIN A1C
Est. average glucose Bld gHb Est-mCnc: 154 mg/dL
Hgb A1c MFr Bld: 7 % — ABNORMAL HIGH (ref 4.8–5.6)

## 2022-07-05 MED ORDER — TIRZEPATIDE 2.5 MG/0.5ML ~~LOC~~ SOAJ
2.5000 mg | SUBCUTANEOUS | 2 refills | Status: DC
  Filled 2022-07-05: qty 2, 28d supply, fill #0

## 2022-07-05 NOTE — Assessment & Plan Note (Addendum)
Patient reports that he had transition to 5 mg Mounjaro dose and was initially doing well, however he feels that he did develop some nausea, belching, diarrhea related to the medication.  He did have recent delay in administering a dose during the week and noticed that symptoms improved during those days after missed dose.  Otherwise, patient has been doing well.  He does continue with glipizide as prescribed. We discussed considerations today, we will plan to transition back to lower dose of Mounjaro and monitor symptoms over the coming 1 to 2 months.  If he does do well with lower dose again, we can look to attempt to titrate to 5 mg dose after about 2 months of being at 2.5 mg dose and assess response.  If issues do return with 5 mg dose, consider returning to 2.5 mg dose and maintaining here or transitioning to alternative GLP-1 receptor agonist injectable medication. Will also continue glipizide at current dose We will check hemoglobin A1c today Plan to complete foot exam at next office visit Provided patient with contact information for endocrinology office so that he may reach out to schedule follow-up appointment with them

## 2022-07-05 NOTE — Progress Notes (Signed)
    Procedures performed today:    None.  Independent interpretation of notes and tests performed by another provider:   None.  Brief History, Exam, Impression, and Recommendations:    BP (!) 127/96 (BP Location: Right Arm, Patient Position: Sitting, Cuff Size: Large)   Pulse 69   Ht 5\' 8"  (1.727 m)   Wt 266 lb 8 oz (120.9 kg)   SpO2 99%   BMI 40.52 kg/m   Diabetes mellitus (HCC) Patient reports that he had transition to 5 mg Mounjaro dose and was initially doing well, however he feels that he did develop some nausea, belching, diarrhea related to the medication.  He did have recent delay in administering a dose during the week and noticed that symptoms improved during those days after missed dose.  Otherwise, patient has been doing well.  He does continue with glipizide as prescribed. We discussed considerations today, we will plan to transition back to lower dose of Mounjaro and monitor symptoms over the coming 1 to 2 months.  If he does do well with lower dose again, we can look to attempt to titrate to 5 mg dose after about 2 months of being at 2.5 mg dose and assess response.  If issues do return with 5 mg dose, consider returning to 2.5 mg dose and maintaining here or transitioning to alternative GLP-1 receptor agonist injectable medication. Will also continue glipizide at current dose We will check hemoglobin A1c today Plan to complete foot exam at next office visit Provided patient with contact information for endocrinology office so that he may reach out to schedule follow-up appointment with them  Return in about 6 weeks (around 08/16/2022).   ___________________________________________ Hawraa Stambaugh de 10/15/2022, MD, ABFM, CAQSM Primary Care and Sports Medicine [facility]

## 2022-07-06 ENCOUNTER — Other Ambulatory Visit (HOSPITAL_BASED_OUTPATIENT_CLINIC_OR_DEPARTMENT_OTHER): Payer: Self-pay

## 2022-07-08 ENCOUNTER — Other Ambulatory Visit (HOSPITAL_BASED_OUTPATIENT_CLINIC_OR_DEPARTMENT_OTHER): Payer: Self-pay

## 2022-07-10 ENCOUNTER — Telehealth (HOSPITAL_BASED_OUTPATIENT_CLINIC_OR_DEPARTMENT_OTHER): Payer: Self-pay

## 2022-07-10 ENCOUNTER — Other Ambulatory Visit (HOSPITAL_BASED_OUTPATIENT_CLINIC_OR_DEPARTMENT_OTHER): Payer: Self-pay

## 2022-07-10 NOTE — Telephone Encounter (Signed)
Received Authorization from [employer] stating prior authorization was approved for [employer].

## 2022-08-15 ENCOUNTER — Telehealth (HOSPITAL_BASED_OUTPATIENT_CLINIC_OR_DEPARTMENT_OTHER): Payer: Self-pay | Admitting: Family Medicine

## 2022-08-15 NOTE — Telephone Encounter (Signed)
Pt states NO to the FLU SHOT

## 2022-08-16 ENCOUNTER — Other Ambulatory Visit (HOSPITAL_BASED_OUTPATIENT_CLINIC_OR_DEPARTMENT_OTHER): Payer: Self-pay

## 2022-08-16 ENCOUNTER — Telehealth (HOSPITAL_BASED_OUTPATIENT_CLINIC_OR_DEPARTMENT_OTHER): Payer: Self-pay | Admitting: Family Medicine

## 2022-08-16 ENCOUNTER — Encounter (HOSPITAL_BASED_OUTPATIENT_CLINIC_OR_DEPARTMENT_OTHER): Payer: Self-pay

## 2022-08-16 ENCOUNTER — Ambulatory Visit (HOSPITAL_BASED_OUTPATIENT_CLINIC_OR_DEPARTMENT_OTHER): Payer: BC Managed Care – PPO | Admitting: Family Medicine

## 2022-08-16 DIAGNOSIS — E1165 Type 2 diabetes mellitus with hyperglycemia: Secondary | ICD-10-CM

## 2022-08-16 MED ORDER — TIRZEPATIDE 2.5 MG/0.5ML ~~LOC~~ SOAJ
2.5000 mg | SUBCUTANEOUS | 2 refills | Status: DC
  Filled 2022-08-16: qty 2, 28d supply, fill #0

## 2022-08-16 NOTE — Telephone Encounter (Signed)
Pt was laid off work, no insurance until the 15, till he can move under [employer].  No  insurance --we dont want the pt to have a [place] spoke with the pt also

## 2022-12-17 ENCOUNTER — Other Ambulatory Visit (HOSPITAL_BASED_OUTPATIENT_CLINIC_OR_DEPARTMENT_OTHER): Payer: Self-pay

## 2022-12-17 ENCOUNTER — Ambulatory Visit (HOSPITAL_BASED_OUTPATIENT_CLINIC_OR_DEPARTMENT_OTHER): Payer: BC Managed Care – PPO | Admitting: Family Medicine

## 2022-12-17 ENCOUNTER — Encounter (HOSPITAL_BASED_OUTPATIENT_CLINIC_OR_DEPARTMENT_OTHER): Payer: Self-pay | Admitting: Family Medicine

## 2022-12-17 VITALS — BP 123/88 | HR 73 | Ht 68.0 in | Wt 256.0 lb

## 2022-12-17 DIAGNOSIS — Z7984 Long term (current) use of oral hypoglycemic drugs: Secondary | ICD-10-CM

## 2022-12-17 DIAGNOSIS — E1165 Type 2 diabetes mellitus with hyperglycemia: Secondary | ICD-10-CM | POA: Diagnosis not present

## 2022-12-17 MED ORDER — TIRZEPATIDE 2.5 MG/0.5ML ~~LOC~~ SOAJ
2.5000 mg | SUBCUTANEOUS | 1 refills | Status: DC
  Filled 2022-12-17 – 2023-01-14 (×3): qty 2, 28d supply, fill #0
  Filled 2023-02-18: qty 2, 28d supply, fill #1

## 2022-12-17 MED ORDER — GLIPIZIDE 10 MG PO TABS
10.0000 mg | ORAL_TABLET | Freq: Two times a day (BID) | ORAL | 1 refills | Status: DC
  Filled 2022-12-17: qty 180, 90d supply, fill #0

## 2022-12-17 NOTE — Progress Notes (Signed)
    Procedures performed today:    None.  Independent interpretation of notes and tests performed by another provider:   None.  Brief History, Exam, Impression, and Recommendations:    BP 123/88 (BP Location: Left Arm, Patient Position: Sitting, Cuff Size: Normal)   Pulse 73   Ht 5\' 8"  (1.727 m)   Wt 256 lb (116.1 kg)   SpO2 99% Comment: RA  BMI 38.92 kg/m   Unfortunately, patient overdue for follow-up.  Last appointment about 6 months ago, was advised on 6-week follow-up at that time.  Diabetes mellitus (HCC) Unfortunate, patient has been without medication due to lack of follow-up.  He previously had been utilizing Mounjaro, was tolerating 2.5 mg dose.  Prior hemoglobin A1c did show improvement with medication regimen, however has not been taking medications more recently.  He was additionally utilizing glipizide in the past.  He is overdue for hemoglobin A1c check at this time. We will restart medications include glipizide 10 mg as well as restarting Mounjaro.  Patient previously had not tolerated metformin due to significant GI side effects We will check hemoglobin A1c today for monitoring We will complete foot exam at next office visit He has previously been referred to endocrinology, did not schedule establishing visit.  Can consider placing new referral pending progress moving forward  Return in about 3 months (around 03/19/2023) for DM.   ___________________________________________ Aylen Stradford de [place], MD, ABFM, CAQSM Primary Care and Sports Medicine [facility]

## 2022-12-18 ENCOUNTER — Other Ambulatory Visit (HOSPITAL_BASED_OUTPATIENT_CLINIC_OR_DEPARTMENT_OTHER): Payer: Self-pay

## 2022-12-18 LAB — BASIC METABOLIC PANEL
BUN/Creatinine Ratio: 8 — ABNORMAL LOW (ref 9–20)
BUN: 9 mg/dL (ref 6–24)
CO2: 26 mmol/L (ref 20–29)
Calcium: 9.9 mg/dL (ref 8.7–10.2)
Chloride: 99 mmol/L (ref 96–106)
Creatinine, Ser: 1.15 mg/dL (ref 0.76–1.27)
Glucose: 238 mg/dL — ABNORMAL HIGH (ref 70–99)
Potassium: 4.6 mmol/L (ref 3.5–5.2)
Sodium: 138 mmol/L (ref 134–144)
eGFR: 80 mL/min/{1.73_m2} (ref >59)

## 2022-12-18 LAB — HEMOGLOBIN A1C
Est. average glucose Bld gHb Est-mCnc: 269 mg/dL
Hgb A1c MFr Bld: 11 % — ABNORMAL HIGH (ref 4.8–5.6)

## 2022-12-19 ENCOUNTER — Other Ambulatory Visit (HOSPITAL_BASED_OUTPATIENT_CLINIC_OR_DEPARTMENT_OTHER): Payer: Self-pay

## 2022-12-31 ENCOUNTER — Other Ambulatory Visit (HOSPITAL_BASED_OUTPATIENT_CLINIC_OR_DEPARTMENT_OTHER): Payer: Self-pay

## 2023-01-02 ENCOUNTER — Other Ambulatory Visit (HOSPITAL_BASED_OUTPATIENT_CLINIC_OR_DEPARTMENT_OTHER): Payer: Self-pay

## 2023-01-02 ENCOUNTER — Telehealth (HOSPITAL_BASED_OUTPATIENT_CLINIC_OR_DEPARTMENT_OTHER): Payer: Self-pay | Admitting: Family Medicine

## 2023-01-02 NOTE — Telephone Encounter (Signed)
Sherie spoke with patient and pharmacy regarding medications. Waiting on PA

## 2023-01-02 NOTE — Telephone Encounter (Signed)
Pt is calling regarding his prescription --please call he has not heard anything

## 2023-01-02 NOTE — Telephone Encounter (Signed)
LVM for patient to return call. 

## 2023-01-03 ENCOUNTER — Telehealth (HOSPITAL_BASED_OUTPATIENT_CLINIC_OR_DEPARTMENT_OTHER): Payer: Self-pay

## 2023-01-03 NOTE — Telephone Encounter (Signed)
Prior authorization was done for patient. The prior authorization was denied. I called patient and he is aware of this. I asked patient would he like to try another medication patient stated that is fine and I will do an appeal for the [facility] first before trying another option.

## 2023-01-08 ENCOUNTER — Other Ambulatory Visit (HOSPITAL_BASED_OUTPATIENT_CLINIC_OR_DEPARTMENT_OTHER): Payer: Self-pay

## 2023-01-09 ENCOUNTER — Other Ambulatory Visit (HOSPITAL_BASED_OUTPATIENT_CLINIC_OR_DEPARTMENT_OTHER): Payer: Self-pay

## 2023-01-11 ENCOUNTER — Other Ambulatory Visit (HOSPITAL_BASED_OUTPATIENT_CLINIC_OR_DEPARTMENT_OTHER): Payer: Self-pay

## 2023-01-14 ENCOUNTER — Telehealth (HOSPITAL_BASED_OUTPATIENT_CLINIC_OR_DEPARTMENT_OTHER): Payer: Self-pay | Admitting: Family Medicine

## 2023-01-14 ENCOUNTER — Other Ambulatory Visit (HOSPITAL_BASED_OUTPATIENT_CLINIC_OR_DEPARTMENT_OTHER): Payer: Self-pay

## 2023-01-14 NOTE — Telephone Encounter (Signed)
Patients wife called, patient was prescribed maunjaro and per pharmacy states needs prior auth, if his insurance does not cover is there something else he can have.

## 2023-01-14 NOTE — Telephone Encounter (Signed)
Patients wife called, she spoke with insurance and they stated the right things may have not been submitted because patient has a severe allergy to metformin. Gave me a phone number to call [phone] which is the insurance company. I will reach out to them and see if a PA needs to be resubmitted or if this can be fixed over the phone.

## 2023-01-14 NOTE — Telephone Encounter (Signed)
Spoke with patients wife, a prior Berkley Harvey was completed and denied per [place]. Let her know that Dr.DeCuba is out until Monday, and can address this matter upon his return. She gave a verbal understanding

## 2023-01-14 NOTE — Telephone Encounter (Signed)
Spoke with Earley Abide from [place] with [employer], resubmitted prior auth over the phone. Status approved

## 2023-01-14 NOTE — Assessment & Plan Note (Signed)
Unfortunate, patient has been without medication due to lack of follow-up.  He previously had been utilizing Mounjaro, was tolerating 2.5 mg dose.  Prior hemoglobin A1c did show improvement with medication regimen, however has not been taking medications more recently.  He was additionally utilizing glipizide in the past.  He is overdue for hemoglobin A1c check at this time. We will restart medications include glipizide 10 mg as well as restarting Mounjaro.  Patient previously had not tolerated metformin due to significant GI side effects We will check hemoglobin A1c today for monitoring We will complete foot exam at next office visit He has previously been referred to endocrinology, did not schedule establishing visit.  Can consider placing new referral pending progress moving forward

## 2023-02-18 ENCOUNTER — Other Ambulatory Visit (HOSPITAL_BASED_OUTPATIENT_CLINIC_OR_DEPARTMENT_OTHER): Payer: Self-pay

## 2023-03-01 IMAGING — DX DG CHEST 2V
2 series · 2 of 2 positions shown · non-contrast
Comparison: 08/05/2017

CLINICAL DATA: Chest pain

EXAM:
CHEST - 2 VIEW

[chest pa]
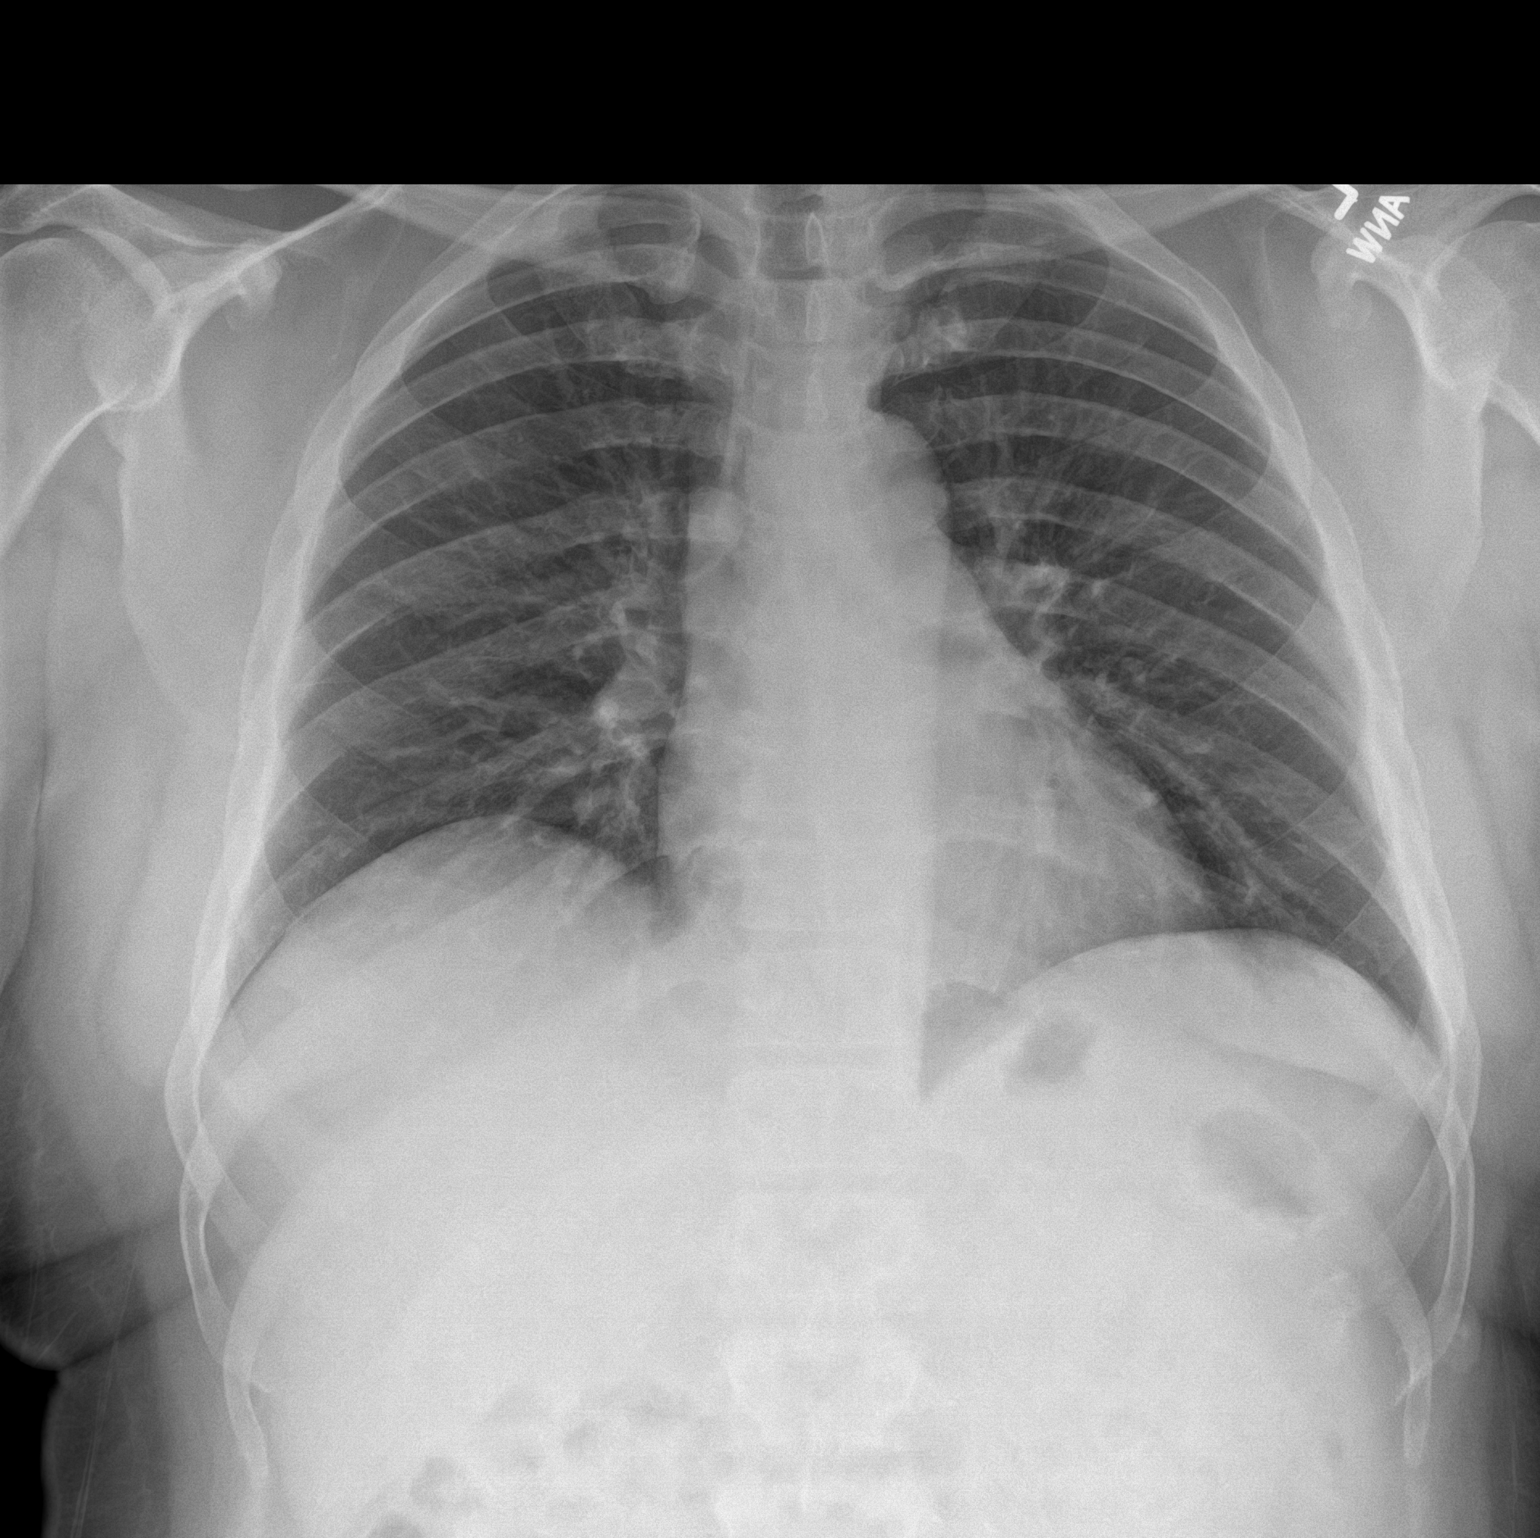

[chest lat]
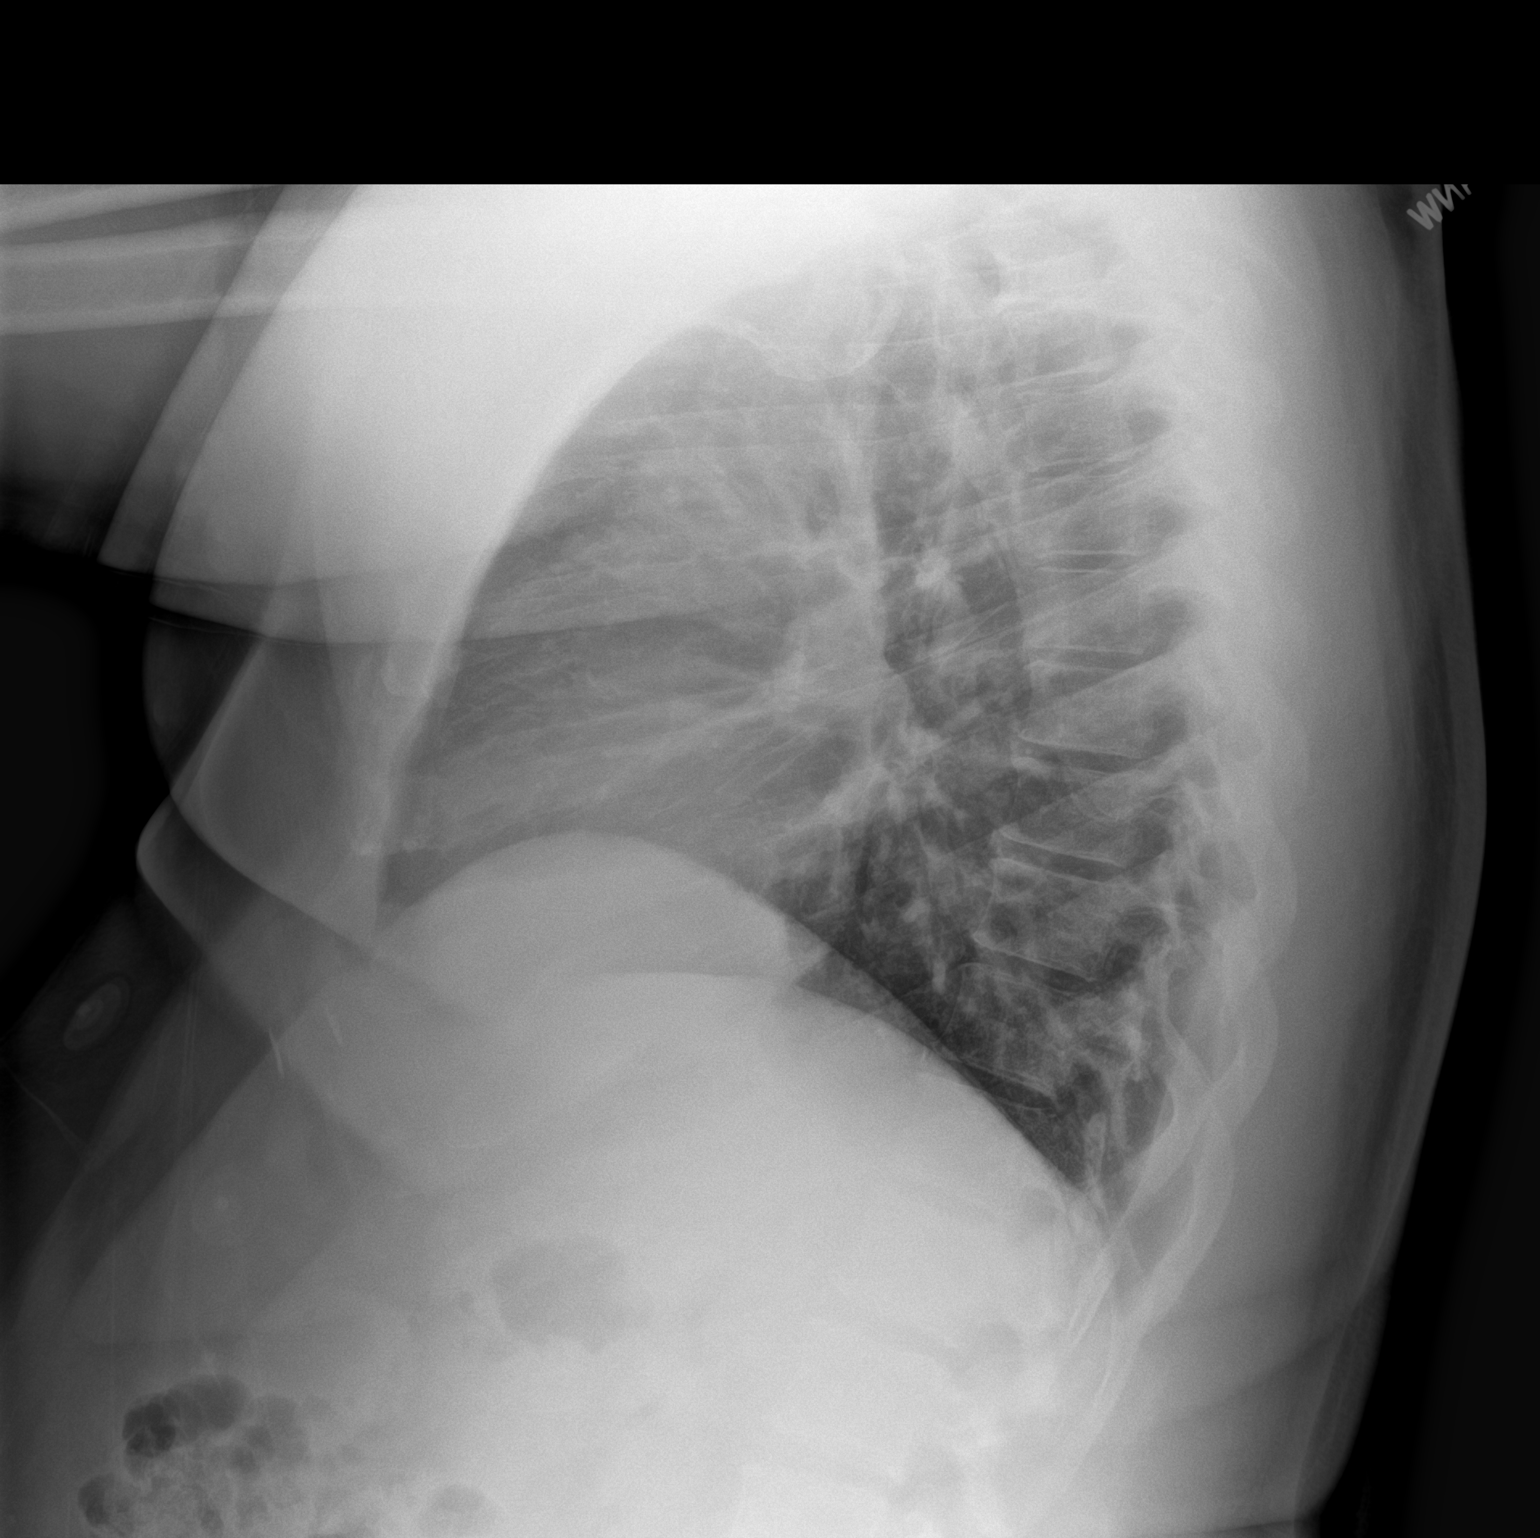

[2 of 2 positions shown; findings below may reference images not displayed]

FINDINGS: The heart size and mediastinal contours are within normal limits.
Both lungs are clear. The visualized skeletal structures are
unremarkable.
IMPRESSION: No active cardiopulmonary disease.

## 2023-03-19 ENCOUNTER — Ambulatory Visit (HOSPITAL_BASED_OUTPATIENT_CLINIC_OR_DEPARTMENT_OTHER): Payer: BC Managed Care – PPO | Admitting: Family Medicine

## 2023-03-26 ENCOUNTER — Encounter (HOSPITAL_BASED_OUTPATIENT_CLINIC_OR_DEPARTMENT_OTHER): Payer: Self-pay | Admitting: Family Medicine

## 2023-03-26 ENCOUNTER — Ambulatory Visit (HOSPITAL_BASED_OUTPATIENT_CLINIC_OR_DEPARTMENT_OTHER): Payer: BC Managed Care – PPO | Admitting: Family Medicine

## 2023-03-26 ENCOUNTER — Other Ambulatory Visit (HOSPITAL_BASED_OUTPATIENT_CLINIC_OR_DEPARTMENT_OTHER): Payer: Self-pay

## 2023-03-26 VITALS — BP 125/84 | HR 91 | Ht 69.0 in | Wt 260.3 lb

## 2023-03-26 DIAGNOSIS — Z1211 Encounter for screening for malignant neoplasm of colon: Secondary | ICD-10-CM | POA: Diagnosis not present

## 2023-03-26 DIAGNOSIS — Z7984 Long term (current) use of oral hypoglycemic drugs: Secondary | ICD-10-CM | POA: Diagnosis not present

## 2023-03-26 DIAGNOSIS — E1165 Type 2 diabetes mellitus with hyperglycemia: Secondary | ICD-10-CM

## 2023-03-26 MED ORDER — GLIPIZIDE 10 MG PO TABS
10.0000 mg | ORAL_TABLET | Freq: Two times a day (BID) | ORAL | 1 refills | Status: DC
  Filled 2023-03-26: qty 180, 90d supply, fill #0
  Filled 2023-08-22: qty 180, 90d supply, fill #1

## 2023-03-26 MED ORDER — TIRZEPATIDE 2.5 MG/0.5ML ~~LOC~~ SOAJ
2.5000 mg | SUBCUTANEOUS | 1 refills | Status: DC
  Filled 2023-03-26: qty 2, 28d supply, fill #0
  Filled 2023-04-19: qty 2, 28d supply, fill #1
  Filled 2023-06-19 (×2): qty 2, 28d supply, fill #2
  Filled 2023-07-11: qty 2, 28d supply, fill #3

## 2023-03-26 NOTE — Assessment & Plan Note (Addendum)
Patient has started back on Mounjaro and glipizide - although he has been inconsistent with each.  He has been more consistent with glipizide, however with Mounjaro he has only administered about half of possible doses since last appointment.  Partly, this is related to lack of insurance.  He did have recent job interview and is hopeful to resume working and have better insurance coverage in the near future.  No current issues with polyuria or polydipsia.  He has been tolerating medication without issue. He would be due for today for recheck of hemoglobin A1c, however due to unemployment and wanting to wait for insurance coverage, patient would prefer to hold off for now and recheck later in the year if possible when he hopefully has employer insurance in place. Patient is also due for urine microalbumin/creatinine ratio, similarly as above he is hoping to hold off on this for now We will complete foot exam at next office visit Consider referral to endocrinology pending progress with current medication

## 2023-03-26 NOTE — Progress Notes (Signed)
    Procedures performed today:    None.  Independent interpretation of notes and tests performed by another provider:   None.  Brief History, Exam, Impression, and Recommendations:    BP 125/84 (BP Location: Right Arm, Patient Position: Sitting, Cuff Size: Normal)   Pulse 91   Ht 5\' 9"  (1.753 m)   Wt 260 lb 4.8 oz (118.1 kg)   SpO2 99%   BMI 38.44 kg/m   Screening for colon cancer -     Cologuard  Type 2 diabetes mellitus with hyperglycemia, without long-term current use of insulin (HCC) Assessment & Plan: Patient has started back on Mounjaro and glipizide - although he has been inconsistent with each.  He has been more consistent with glipizide, however with Mounjaro he has only administered about half of possible doses since last appointment.  Partly, this is related to lack of insurance.  He did have recent job interview and is hopeful to resume working and have better insurance coverage in the near future.  No current issues with polyuria or polydipsia.  He has been tolerating medication without issue. He would be due for today for recheck of hemoglobin A1c, however due to unemployment and wanting to wait for insurance coverage, patient would prefer to hold off for now and recheck later in the year if possible when he hopefully has employer insurance in place. Patient is also due for urine microalbumin/creatinine ratio, similarly as above he is hoping to hold off on this for now We will complete foot exam at next office visit Consider referral to endocrinology pending progress with current medication  Orders: -     Tirzepatide; Inject 2.5 mg into the skin once a week.  Dispense: 6 mL; Refill: 1  Other orders -     glipiZIDE; Take 1 tablet (10 mg total) by mouth 2 (two) times daily before a meal.  Dispense: 180 tablet; Refill: 1  Current management complicated by social determinants given lack of employment currently and thus awaiting better health insurance coverage.  This  does make things difficult in regards to ensuring that patient is taking appropriate medication as prescribed as well as having appropriate follow-up and appropriate timing of labs  Return in about 2 months (around 05/26/2023) for diabetes.   ___________________________________________ Kenika Sahm de Peru, MD, ABFM, CAQSM Primary Care and Sports Medicine [facility]

## 2023-04-19 ENCOUNTER — Other Ambulatory Visit (HOSPITAL_BASED_OUTPATIENT_CLINIC_OR_DEPARTMENT_OTHER): Payer: Self-pay

## 2023-05-27 ENCOUNTER — Ambulatory Visit (HOSPITAL_BASED_OUTPATIENT_CLINIC_OR_DEPARTMENT_OTHER): Payer: BC Managed Care – PPO | Admitting: Family Medicine

## 2023-06-11 ENCOUNTER — Ambulatory Visit (HOSPITAL_BASED_OUTPATIENT_CLINIC_OR_DEPARTMENT_OTHER): Payer: BC Managed Care – PPO | Admitting: Family Medicine

## 2023-06-17 ENCOUNTER — Ambulatory Visit (HOSPITAL_BASED_OUTPATIENT_CLINIC_OR_DEPARTMENT_OTHER): Payer: BC Managed Care – PPO | Admitting: Family Medicine

## 2023-06-19 ENCOUNTER — Other Ambulatory Visit (HOSPITAL_BASED_OUTPATIENT_CLINIC_OR_DEPARTMENT_OTHER): Payer: Self-pay

## 2023-06-25 ENCOUNTER — Encounter (HOSPITAL_BASED_OUTPATIENT_CLINIC_OR_DEPARTMENT_OTHER): Payer: Self-pay | Admitting: *Deleted

## 2023-07-11 ENCOUNTER — Other Ambulatory Visit (HOSPITAL_BASED_OUTPATIENT_CLINIC_OR_DEPARTMENT_OTHER): Payer: Self-pay | Admitting: Family Medicine

## 2023-07-11 ENCOUNTER — Other Ambulatory Visit (HOSPITAL_BASED_OUTPATIENT_CLINIC_OR_DEPARTMENT_OTHER): Payer: Self-pay

## 2023-07-11 DIAGNOSIS — E1165 Type 2 diabetes mellitus with hyperglycemia: Secondary | ICD-10-CM

## 2023-07-11 NOTE — Telephone Encounter (Signed)
 Copied from CRM [id number]. Topic: Clinical - Medication Refill >> Jul 11, 2023  3:41 PM Delon DASEN wrote: Most Recent Primary Care Visit:  Provider: DE CUBA, RAYMOND J  Department: [facility]  Visit Type: FOLLOW UP  Date: 03/26/2023  Medication: tirzepatide  (MOUNJARO ) 2.5 MG/0.5ML Pen  Has the patient contacted their pharmacy?  (Agent: If no, request that the patient contact the pharmacy for the refill. If patient does not wish to contact the pharmacy document the reason why and proceed with request.) (Agent: If yes, when and what did the pharmacy advise?)  Is this the correct pharmacy for this prescription?  If no, delete pharmacy and type the correct one.  This is the patient's preferred pharmacy:  [facility] [address] [place] [address] Phone: [phone] Fax: [phone]   Has the prescription been filled recently?   Is the patient out of the medication?   Has the patient been seen for an appointment in the last year OR does the patient have an upcoming appointment?   Can we respond through MyChart?   Agent: Please be advised that Rx refills may take up to 3 business days. We ask that you follow-up with your pharmacy.

## 2023-07-12 ENCOUNTER — Other Ambulatory Visit (HOSPITAL_BASED_OUTPATIENT_CLINIC_OR_DEPARTMENT_OTHER): Payer: Self-pay

## 2023-07-12 MED ORDER — TIRZEPATIDE 2.5 MG/0.5ML ~~LOC~~ SOAJ
2.5000 mg | SUBCUTANEOUS | 1 refills | Status: DC
  Filled 2023-07-12: qty 6, 84d supply, fill #0
  Filled 2023-08-22: qty 2, 28d supply, fill #0
  Filled 2023-09-30: qty 2, 28d supply, fill #1
  Filled 2023-10-30: qty 2, 28d supply, fill #2

## 2023-07-16 NOTE — Telephone Encounter (Signed)
 LVM to schedule an appt also mychart msg sent

## 2023-07-16 NOTE — Telephone Encounter (Signed)
 LVM for the patient to call to scheduled FU

## 2023-07-22 ENCOUNTER — Encounter (HOSPITAL_BASED_OUTPATIENT_CLINIC_OR_DEPARTMENT_OTHER): Payer: Self-pay | Admitting: *Deleted

## 2023-08-22 ENCOUNTER — Other Ambulatory Visit (HOSPITAL_BASED_OUTPATIENT_CLINIC_OR_DEPARTMENT_OTHER): Payer: Self-pay

## 2023-09-30 ENCOUNTER — Other Ambulatory Visit (HOSPITAL_BASED_OUTPATIENT_CLINIC_OR_DEPARTMENT_OTHER): Payer: Self-pay

## 2023-10-01 ENCOUNTER — Other Ambulatory Visit (HOSPITAL_BASED_OUTPATIENT_CLINIC_OR_DEPARTMENT_OTHER): Payer: Self-pay

## 2023-10-28 ENCOUNTER — Telehealth (HOSPITAL_BASED_OUTPATIENT_CLINIC_OR_DEPARTMENT_OTHER): Payer: Self-pay | Admitting: *Deleted

## 2023-10-28 NOTE — Telephone Encounter (Signed)
 Patient needs an appt to discuss with provider referral has not been placed.

## 2023-10-28 NOTE — Telephone Encounter (Signed)
 Copied from CRM [id number]. Topic: Referral - Status >> Oct 28, 2023 12:42 PM Zipporah Him wrote: Reason for CRM: Patients wife Rayann requesting a call back at [phone], trying to figure out what the hold up is with his referral request to dermatology.

## 2023-10-30 ENCOUNTER — Other Ambulatory Visit (HOSPITAL_BASED_OUTPATIENT_CLINIC_OR_DEPARTMENT_OTHER): Payer: Self-pay

## 2023-11-08 ENCOUNTER — Other Ambulatory Visit (HOSPITAL_BASED_OUTPATIENT_CLINIC_OR_DEPARTMENT_OTHER): Payer: Self-pay

## 2023-11-08 MED ORDER — AMOXICILLIN-POT CLAVULANATE 875-125 MG PO TABS
ORAL_TABLET | ORAL | 0 refills | Status: DC
  Filled 2023-11-08: qty 14, 7d supply, fill #0

## 2023-11-08 MED ORDER — ALBUTEROL SULFATE HFA 108 (90 BASE) MCG/ACT IN AERS
INHALATION_SPRAY | RESPIRATORY_TRACT | 0 refills | Status: AC
  Filled 2023-11-08: qty 8.5, 30d supply, fill #0

## 2023-11-09 ENCOUNTER — Other Ambulatory Visit (HOSPITAL_BASED_OUTPATIENT_CLINIC_OR_DEPARTMENT_OTHER): Payer: Self-pay

## 2023-11-20 ENCOUNTER — Ambulatory Visit (HOSPITAL_BASED_OUTPATIENT_CLINIC_OR_DEPARTMENT_OTHER): Admitting: Family Medicine

## 2023-11-28 ENCOUNTER — Ambulatory Visit (HOSPITAL_BASED_OUTPATIENT_CLINIC_OR_DEPARTMENT_OTHER): Admitting: Family Medicine

## 2023-12-25 ENCOUNTER — Other Ambulatory Visit (HOSPITAL_BASED_OUTPATIENT_CLINIC_OR_DEPARTMENT_OTHER): Payer: Self-pay

## 2023-12-25 ENCOUNTER — Other Ambulatory Visit (HOSPITAL_BASED_OUTPATIENT_CLINIC_OR_DEPARTMENT_OTHER): Payer: Self-pay | Admitting: Family Medicine

## 2023-12-30 ENCOUNTER — Other Ambulatory Visit (HOSPITAL_BASED_OUTPATIENT_CLINIC_OR_DEPARTMENT_OTHER): Payer: Self-pay

## 2023-12-30 NOTE — Telephone Encounter (Signed)
 Medication: Rx #: [id number]  glipiZIDE  (GLUCOTROL ) 10 MG tablet [[id number]]  Appointment scheduled for 01/06/2024  Has the patient contacted their pharmacy? Yes (Agent: If no, request that the patient contact the pharmacy for the refill. If patient does not wish to contact the pharmacy document the reason why and proceed with request.) (Agent: If yes, when and what did the pharmacy advise?)  This is the patient's preferred pharmacy:  [facility] [address] [place] [address] Phone: [phone] Fax: [phone]  Is this the correct pharmacy for this prescription? Yes If no, delete pharmacy and type the correct one.   Has the prescription been filled recently? Yes  Is the patient out of the medication? Yes Patient took last pill on Saturday 12/28/23 Requesting a courtesy dosage until appointment   Has the patient been seen for an appointment in the last year OR does the patient have an upcoming appointment? Yes  Can we respond through MyChart? Yes  Agent: Please be advised that Rx refills may take up to 3 business days. We ask that you follow-up with your pharmacy.

## 2024-01-03 ENCOUNTER — Ambulatory Visit: Payer: Self-pay

## 2024-01-03 ENCOUNTER — Other Ambulatory Visit (HOSPITAL_BASED_OUTPATIENT_CLINIC_OR_DEPARTMENT_OTHER): Payer: Self-pay

## 2024-01-03 ENCOUNTER — Other Ambulatory Visit (HOSPITAL_BASED_OUTPATIENT_CLINIC_OR_DEPARTMENT_OTHER): Payer: Self-pay | Admitting: Family Medicine

## 2024-01-03 NOTE — Telephone Encounter (Signed)
 FYI Only or Action Required?: Action required by provider: request for appointment.  Patient was last seen in primary care on 03/26/2023 by de Peru, Quintin PARAS, MD. Called Nurse Triage reporting Blood Sugar Problem. Symptoms began a week ago. Interventions attempted: Rest, hydration, or home remedies. Symptoms are: gradually worsening.  Triage Disposition: Call PCP Within 24 Hours  Patient/caregiver understands and will follow disposition?: No, refuses dispositionCopied from CRM [id number]. Topic: Clinical - Red Word Triage >> Jan 03, 2024  3:43 PM Silvana PARAS wrote: Red Word that prompted transfer to Nurse Triage: Blood sugar dropping, and blurry vision. Reason for Disposition  [1] Blood glucose 70  mg/dL (3.9 mmol/L) or below OR symptomatic, now improved with Care Advice AND [2] cause unknown  Answer Assessment - Initial Assessment Questions 1. SYMPTOMS: What symptoms are you concerned about?     Blurry vision 2. ONSET:  When did the symptoms start?     Several days ago  3. BLOOD GLUCOSE: What is your blood glucose level?      Not sure 4. USUAL RANGE: What is your blood glucose level usually? (e.g., usual fasting morning value, usual evening value)     Na  5. TYPE 1 or 2:  Do you know what type of diabetes you have?  (e.g., Type 1, Type 2, Gestational; doesn't know)      Type 2 6. INSULIN: Do you take insulin? What type of insulin(s) do you use? What is the mode of delivery? (syringe, pen; injection or pump) When did you last give yourself an insulin dose? (i.e., time or hours/minutes ago) How much did you give? (i.e., how many units)     Na  7. DIABETES PILLS: Do you take any pills for your diabetes? If Yes, ask: What is the name of the medicine(s) that you take for high blood sugar?     Glipizide - but have been out for over a week.  8. OTHER SYMPTOMS: Do you have any symptoms? (e.g., fever, frequent urination, difficulty breathing, vomiting)     Sweaty, frequent  urination, shaking, heart palpations  9. LOW BLOOD GLUCOSE TREATMENT: What have you done so far to treat the low blood glucose level?     Protein 10. FOOD: When did you last eat or drink?       Chicken, shrimp, ham-just finished. 11. ALONE: Are you alone right now or is someone with you?        No-wife  Pt is on mounjaro  weekly. Pt has been out of Glipizide  for over a week. Family is unable to  check blood sugar due to no meter. Wife RayAnn feels his symptoms are when his sugar runs low. RN advised they go to UC but wife feels he will refuse.  Protocols used: Diabetes - Low Blood Sugar-A-AH

## 2024-01-03 NOTE — Telephone Encounter (Unsigned)
 Copied from CRM [id number]. Topic: Clinical - Medication Refill >> Jan 03, 2024  3:41 PM Silvana PARAS wrote: Medication: glipiZIDE  (GLUCOTROL ) 10 MG tablet  Has the patient contacted their pharmacy? Yes (Agent: If no, request that the patient contact the pharmacy for the refill. If patient does not wish to contact the pharmacy document the reason why and proceed with request.) (Agent: If yes, when and what did the pharmacy advise?)  This is the patient's preferred pharmacy:  [facility] [address] [place] [address] Phone: [phone] Fax: [phone]  Is this the correct pharmacy for this prescription? Yes If no, delete pharmacy and type the correct one.   Has the prescription been filled recently? Yes  Is the patient out of the medication? Yes  Has the patient been seen for an appointment in the last year OR does the patient have an upcoming appointment? Yes  Can we respond through MyChart? No  Agent: Please be advised that Rx refills may take up to 3 business days. We ask that you follow-up with your pharmacy.

## 2024-01-06 ENCOUNTER — Encounter (HOSPITAL_BASED_OUTPATIENT_CLINIC_OR_DEPARTMENT_OTHER): Payer: Self-pay | Admitting: Family Medicine

## 2024-01-06 ENCOUNTER — Telehealth (HOSPITAL_BASED_OUTPATIENT_CLINIC_OR_DEPARTMENT_OTHER): Payer: Self-pay | Admitting: *Deleted

## 2024-01-06 ENCOUNTER — Ambulatory Visit (HOSPITAL_BASED_OUTPATIENT_CLINIC_OR_DEPARTMENT_OTHER): Admitting: Family Medicine

## 2024-01-06 NOTE — Telephone Encounter (Signed)
 Can we please let patient know he has been dismissed

## 2024-01-06 NOTE — Telephone Encounter (Signed)
 Copied from CRM [id number]. Topic: Appointments - Scheduling Inquiry for Clinic >> Jan 06, 2024 11:19 AM Winona R wrote: Pt wife Rayann calling to inquire about Pt remaining a pt of the practice. They have been notified that the pt has been discharged from the office. However she really wants him to fix the relationship with the office. I explained the behavior of no shows and cancellation however she seems to believe its in relation to his depression please reach out to pt at [phone]

## 2024-01-06 NOTE — Telephone Encounter (Signed)
 Spoke with pt wife. Per provider patient has had repeated no shows will need to establish with a different provider. She wanted to know local offices that were taking new patients. Advised to call back and let them know patient dismissed and needed a new provider they would find local provider to their area with verbal understanding

## 2024-01-06 NOTE — Telephone Encounter (Signed)
 Copied from CRM [id number]. Topic: General - Other >> Jan 06, 2024 10:13 AM Marissa P wrote: Reason for CRM: Wife called she stated did not get name but said she is mrs Benito, gave 3 identifiers, changed numbers, also asked can someone call back to get her husband (the patient rescheduled) please [phone]

## 2024-01-08 ENCOUNTER — Other Ambulatory Visit (HOSPITAL_BASED_OUTPATIENT_CLINIC_OR_DEPARTMENT_OTHER): Payer: Self-pay

## 2024-01-09 ENCOUNTER — Other Ambulatory Visit (HOSPITAL_BASED_OUTPATIENT_CLINIC_OR_DEPARTMENT_OTHER): Payer: Self-pay

## 2024-01-09 ENCOUNTER — Telehealth (HOSPITAL_BASED_OUTPATIENT_CLINIC_OR_DEPARTMENT_OTHER): Payer: Self-pay | Admitting: *Deleted

## 2024-01-09 ENCOUNTER — Other Ambulatory Visit (HOSPITAL_BASED_OUTPATIENT_CLINIC_OR_DEPARTMENT_OTHER): Payer: Self-pay | Admitting: *Deleted

## 2024-01-09 DIAGNOSIS — E1165 Type 2 diabetes mellitus with hyperglycemia: Secondary | ICD-10-CM

## 2024-01-09 MED ORDER — TIRZEPATIDE 2.5 MG/0.5ML ~~LOC~~ SOAJ
2.5000 mg | SUBCUTANEOUS | 1 refills | Status: AC
  Filled 2024-01-09: qty 2, 28d supply, fill #0
  Filled 2024-07-13: qty 2, 28d supply, fill #1

## 2024-01-09 MED ORDER — GLIPIZIDE 10 MG PO TABS
10.0000 mg | ORAL_TABLET | Freq: Two times a day (BID) | ORAL | 1 refills | Status: DC
  Filled 2024-01-09: qty 180, 90d supply, fill #0
  Filled 2024-07-13: qty 180, 90d supply, fill #1

## 2024-01-09 NOTE — Telephone Encounter (Signed)
 Copied from CRM [id number]. Topic: Clinical - Medication Question >> Jan 09, 2024  3:38 PM Fonda T wrote: Reason for CRM: Patient spouse, Rayann (DPR verified), calling to check status of a refill on medications, glipiZIDE  (GLUCOTROL ) 10 MG tablet, and tirzepatide  (MOUNJARO ) 2.5 MG/0.5ML Pen  Patient spouse is requesting a return call to discuss further, at [phone].

## 2024-01-09 NOTE — Telephone Encounter (Signed)
 Spoke with patient wife for the 2nd time since discharge. Patient wife would like to know if we provide reminder letter for appointments. Advised we send reminder phone calls for appointments. Patient also needs 30 day supply of glipizide  and mounjaro  refilled. Patient wife said she never received any offices patient could go to. Advised per our last conversation she could call back to [facility] agent and they would be able to provide her with local offices taking new patients. Patient wanted to know could Dr everitt Peru call her back or could she come up here and speak with Dr everitt Peru. Advised this decision was made per Dr everitt Peru to dismiss from the practice due to no shows but I would send message over to provider. Patient wife stated her next questions would depend on whether provider calls her back himself or not.

## 2024-01-09 NOTE — Telephone Encounter (Signed)
 Copied from CRM [id number]. Topic: General - Call Back - No Documentation >> Jan 09, 2024  1:56 PM Sasha H wrote: Reason for CRM: Pt's wife is wanting a call back in regards to pt being discharged from [facility]. She has a few questions, says it urgent

## 2024-01-09 NOTE — Telephone Encounter (Signed)
See other phone msg

## 2024-01-09 NOTE — Telephone Encounter (Signed)
 Please see other phone message spoke with patient wife at 2:29 advised a message would be sent to provider regarding dismissal and medications. Will call wife back once medications are refilled.

## 2024-01-09 NOTE — Telephone Encounter (Signed)
 Patient wife advised refills on medication had been sent. Patient wife advised of providers recommendations with verbal understanding. Stated thank you for your help and hung up

## 2024-01-09 NOTE — Telephone Encounter (Signed)
 Copied from CRM [id number]. Topic: General - Call Back - No Documentation >> Jan 09, 2024  1:56 PM Sasha H wrote: Reason for CRM: Pt's wife is wanting a call back in regards to pt being discharged from [facility]. She has a few questions, says it urgent

## 2024-01-15 ENCOUNTER — Other Ambulatory Visit (HOSPITAL_BASED_OUTPATIENT_CLINIC_OR_DEPARTMENT_OTHER): Payer: Self-pay

## 2024-03-04 ENCOUNTER — Encounter: Payer: Self-pay | Admitting: Adult Health

## 2024-04-17 NOTE — Progress Notes (Signed)
 Fahed Morten                                          MRN: [id number]   04/17/2024   The [facility] Quality Team Specialist reviewed this patient medical record for the purposes of chart review for care gap closure. The following were reviewed: chart review for care gap closure-kidney health evaluation for diabetes:eGFR  and uACR.    [facility] Quality Team

## 2024-05-18 NOTE — Progress Notes (Unsigned)
   05/18/2024  Patient ID: Devin Boyd, male   DOB: Dec 24, 1975, 48 y.o.   MRN: [id number]  Pharmacy Quality Measure Review  This patient is appearing on a report for being at risk of failing the Glycemic Status Assessment in Diabetes measure this calendar year.   not seen by Dr everitt Cuba in over a year. pt intended to transfer to [facility] but no showed.   Lang Sieve, PharmD, BCGP Clinical Pharmacist  [phone]

## 2024-07-13 ENCOUNTER — Other Ambulatory Visit (HOSPITAL_BASED_OUTPATIENT_CLINIC_OR_DEPARTMENT_OTHER): Payer: Self-pay

## 2024-08-14 ENCOUNTER — Ambulatory Visit: Payer: Self-pay

## 2024-08-14 ENCOUNTER — Other Ambulatory Visit (HOSPITAL_BASED_OUTPATIENT_CLINIC_OR_DEPARTMENT_OTHER): Payer: Self-pay

## 2024-08-14 VITALS — BP 142/91 | HR 83 | Temp 98.1°F | Resp 16 | Ht 69.0 in | Wt 257.0 lb

## 2024-08-14 DIAGNOSIS — E1165 Type 2 diabetes mellitus with hyperglycemia: Secondary | ICD-10-CM

## 2024-08-14 DIAGNOSIS — R030 Elevated blood-pressure reading, without diagnosis of hypertension: Secondary | ICD-10-CM

## 2024-08-14 DIAGNOSIS — K649 Unspecified hemorrhoids: Secondary | ICD-10-CM

## 2024-08-14 MED ORDER — EMPAGLIFLOZIN 10 MG PO TABS
10.0000 mg | ORAL_TABLET | Freq: Every day | ORAL | 2 refills | Status: AC
  Filled 2024-08-14: qty 30, 30d supply, fill #0

## 2024-08-14 MED ORDER — GLIPIZIDE 10 MG PO TABS
10.0000 mg | ORAL_TABLET | Freq: Two times a day (BID) | ORAL | 2 refills | Status: AC
  Filled 2024-08-14: qty 180, 90d supply, fill #0

## 2024-08-14 MED ORDER — HYDROCORTISONE 2.5 % EX CREA
TOPICAL_CREAM | Freq: Two times a day (BID) | CUTANEOUS | 0 refills | Status: AC
  Filled 2024-08-14: qty 30, 30d supply, fill #0

## 2024-08-14 NOTE — Progress Notes (Unsigned)
" ° ° ° °  Patient ID: Devin Boyd, male    DOB: [birth year]  MRN: [id number]  CC: Establish Care   Subjective: Devin Boyd is a 49 y.o. male with past medical history of diabetes who presents to clinic for  October. No mounjaro  since October.     At least 2 years,Ringing in right ear. Decreased hearing. Getting worse.    Allergies[1]  ROS: Review of Systems Negative except as stated above  PHYSICAL EXAM: BP (!) 142/91   Pulse 83   Temp 98.1 F (36.7 C) (Oral)   Resp 16   Ht 5' 9 (1.753 m)   Wt 257 lb (116.6 kg)   SpO2 95%   BMI 37.95 kg/m   Physical Exam  General: well-appearing, no acute distress Skin: no jaundice, rashes, or lesions Cardiovascular: regular heart rate and rhythm, normal S1/S2, no murmurs, gallops, or rubs, peripheral pulses 2+ bilaterally Chest: no skeletal deformity, lungs clear to auscultation bilaterally, equal breath sounds bilaterally Abdomen: soft, non-distended, non-tender to palpation, no hepatomegaly, no splenomegaly, normoactive bowel sounds Musculoskeletal: normal gait Extremities: no peripheral edema  ASSESSMENT AND PLAN:  1. Type 2 diabetes mellitus with hyperglycemia, without long-term current use of insulin (HCC) (Primary) - HgB A1c - glipiZIDE  (GLUCOTROL ) 10 MG tablet; Take 1 tablet (10 mg total) by mouth 2 (two) times daily before a meal.  Dispense: 180 tablet; Refill: 2 - empagliflozin  (JARDIANCE ) 10 MG TABS tablet; Take 1 tablet (10 mg total) by mouth daily.  Dispense: 90 tablet; Refill: 2  2. Elevated blood pressure reading *** - empagliflozin  (JARDIANCE ) 10 MG TABS tablet; Take 1 tablet (10 mg total) by mouth daily.  Dispense: 90 tablet; Refill: 2  3. Hemorrhoids, unspecified hemorrhoid type *** - hydrocortisone  2.5 % cream; Apply topically 2 (two) times daily.  Dispense: 30 g; Refill: 0     Patient was given the opportunity to ask questions.  Patient verbalized understanding of the plan and was able to repeat key  elements of the plan.    Orders Placed This Encounter  Procedures   HgB A1c     Requested Prescriptions    No prescriptions requested or ordered in this encounter    No follow-ups on file.  Blayne Garlick Tapia Lurleen Soltero, PA-C     [1]  Allergies Allergen Reactions   Metformin  Hcl Other (See Comments)   "
